# Patient Record
Sex: Female | Born: 1935 | Race: White | Hispanic: No | Smoking: Former smoker
Health system: Southern US, Community
[De-identification: ages and names within clinical notes are randomized; demographics above are authoritative.]

## PROBLEM LIST (undated history)

## (undated) DIAGNOSIS — I10 Essential (primary) hypertension: Secondary | ICD-10-CM

## (undated) DIAGNOSIS — F0390 Unspecified dementia without behavioral disturbance: Secondary | ICD-10-CM

## (undated) DIAGNOSIS — I499 Cardiac arrhythmia, unspecified: Secondary | ICD-10-CM

## (undated) DIAGNOSIS — M810 Age-related osteoporosis without current pathological fracture: Secondary | ICD-10-CM

## (undated) DIAGNOSIS — R001 Bradycardia, unspecified: Secondary | ICD-10-CM

## (undated) DIAGNOSIS — F419 Anxiety disorder, unspecified: Secondary | ICD-10-CM

## (undated) DIAGNOSIS — G4700 Insomnia, unspecified: Secondary | ICD-10-CM

---

## 2010-02-12 ENCOUNTER — Emergency Department: Payer: Self-pay | Admitting: Internal Medicine

## 2010-04-18 ENCOUNTER — Ambulatory Visit: Payer: Self-pay | Admitting: Unknown Physician Specialty

## 2010-07-06 ENCOUNTER — Emergency Department: Payer: Self-pay | Admitting: Emergency Medicine

## 2010-10-19 ENCOUNTER — Encounter: Payer: Self-pay | Admitting: Internal Medicine

## 2011-07-25 ENCOUNTER — Encounter: Payer: Self-pay | Admitting: Internal Medicine

## 2011-07-30 ENCOUNTER — Telehealth: Payer: Self-pay | Admitting: *Deleted

## 2011-07-30 NOTE — Telephone Encounter (Signed)
Spoke with Boyd Kerbs from [employer] [employer] at [place] regarding patients UA results. She says that patient has been really confused and just not actin herself. She says that they will need a written order from you to start the bactrum or whatever treatment needs to be done. She faxed me over patients Vial of Life since we do not have her chart and put in patient's meds and allergies. Order will need to be faxed over to [phone].

## 2011-07-31 MED ORDER — SULFAMETHOXAZOLE-TRIMETHOPRIM 800-160 MG PO TABS: 1.0000 | ORAL_TABLET | Freq: Two times a day (BID) | ORAL | Status: AC

## 2011-07-31 NOTE — Telephone Encounter (Signed)
Bactrim DS tab po bid x 7 days, disp 14. Please confirm NO sulfa allergy.

## 2011-07-31 NOTE — Telephone Encounter (Signed)
Patient does not have a sulfa allergy. Printed rx for Dr. Dan Humphreys to sign. Faxed signed rx to [facility].

## 2011-08-01 ENCOUNTER — Other Ambulatory Visit: Payer: Self-pay | Admitting: Internal Medicine

## 2011-08-01 MED ORDER — AMLODIPINE BESY-BENAZEPRIL HCL 10-20 MG PO CAPS: 1.0000 | ORAL_CAPSULE | Freq: Every day | ORAL | Status: DC

## 2011-08-01 MED ORDER — DONEPEZIL HCL 10 MG PO TABS: 10.0000 mg | ORAL_TABLET | Freq: Every day | ORAL | Status: AC

## 2011-08-14 ENCOUNTER — Encounter: Payer: Self-pay | Admitting: Internal Medicine

## 2011-08-16 ENCOUNTER — Telehealth: Payer: Self-pay | Admitting: *Deleted

## 2011-08-16 ENCOUNTER — Encounter: Payer: Self-pay | Admitting: *Deleted

## 2011-08-16 NOTE — Telephone Encounter (Signed)
Message copied by Jobie Quaker on Fri Aug 16, 2011  8:54 AM ------      Message from: Ronna Polio A      Created: Wed Aug 14, 2011  8:07 AM      Regarding: labs       Labs were normal. We can review at next visit.

## 2011-08-16 NOTE — Telephone Encounter (Signed)
Boyd Kerbs @ [facility] called to see when ms Delarocha was diagnosed with demitia   She needs dates Please call

## 2011-08-16 NOTE — Telephone Encounter (Signed)
Can you please take care of this?

## 2011-08-19 ENCOUNTER — Encounter: Payer: Self-pay | Admitting: Internal Medicine

## 2011-08-27 ENCOUNTER — Encounter: Payer: Self-pay | Admitting: Internal Medicine

## 2011-09-19 ENCOUNTER — Encounter: Payer: Self-pay | Admitting: Internal Medicine

## 2011-09-26 ENCOUNTER — Ambulatory Visit: Payer: Self-pay

## 2011-10-15 ENCOUNTER — Telehealth: Payer: Self-pay | Admitting: Internal Medicine

## 2011-10-15 NOTE — Telephone Encounter (Signed)
Received results on abnormal urinalysis from [facility]. It was ordered by Dr. Dareen Piano. Has pt been treated?

## 2011-10-16 MED ORDER — CIPROFLOXACIN HCL 250 MG PO TABS: 250.0000 mg | ORAL_TABLET | Freq: Two times a day (BID) | ORAL | Status: AC

## 2011-10-16 NOTE — Telephone Encounter (Signed)
Spoke w/patient, she was unsure, asked to speak with nurse but no one was avail at the time. Will call back later to discuss w/nurse.

## 2011-10-16 NOTE — Telephone Encounter (Signed)
Spoke w/nurse. Patient has not been treated, they put Dr Ewell Poe name in error, should have been under Dr Dan Humphreys. Would you like to prescribe antibiotics? Culture is still pending. Fax # [phone]

## 2011-10-16 NOTE — Telephone Encounter (Signed)
Faxed order and called facility to inform

## 2011-10-16 NOTE — Telephone Encounter (Signed)
Cipro 250 mg po bid x 7 days.

## 2011-10-19 ENCOUNTER — Encounter: Payer: Self-pay | Admitting: Internal Medicine

## 2011-10-30 ENCOUNTER — Ambulatory Visit (INDEPENDENT_AMBULATORY_CARE_PROVIDER_SITE_OTHER): Payer: Medicare Other | Admitting: Internal Medicine

## 2011-10-30 ENCOUNTER — Encounter: Payer: Self-pay | Admitting: Internal Medicine

## 2011-10-30 DIAGNOSIS — G4700 Insomnia, unspecified: Secondary | ICD-10-CM

## 2011-10-30 DIAGNOSIS — F0390 Unspecified dementia without behavioral disturbance: Secondary | ICD-10-CM

## 2011-10-30 DIAGNOSIS — J40 Bronchitis, not specified as acute or chronic: Secondary | ICD-10-CM

## 2011-10-30 DIAGNOSIS — F0280 Dementia in other diseases classified elsewhere without behavioral disturbance: Secondary | ICD-10-CM | POA: Insufficient documentation

## 2011-10-30 DIAGNOSIS — R300 Dysuria: Secondary | ICD-10-CM

## 2011-10-30 MED ORDER — AZITHROMYCIN 250 MG PO TABS: ORAL_TABLET | ORAL | Status: DC

## 2011-10-30 MED ORDER — AZITHROMYCIN 250 MG PO TABS: ORAL_TABLET | ORAL | Status: AC

## 2011-10-30 NOTE — Progress Notes (Signed)
Subjective:    Patient ID: Jamie Perez, female    DOB: [birth year], 77 y.o.   MRN: [id number]  HPI 77 year old female with a history of dementia presents for an acute visit. Nursing staff at her facility noted her to have increased nasal congestion and some wheezing on exam. Nursing staff denies any fever. Patient is unable to provide any history.  Nursing staff also notes some increased urinary frequency. He denies any fever or chills. Staff was also noted that patient has difficulty sleeping. They have recently changed her from trazodone and Ambien.  Outpatient Encounter Prescriptions as of 10/30/2011  Medication Sig Dispense Refill  . amLODipine-benazepril (LOTREL) 10-20 MG per capsule Take 1 capsule by mouth daily.  30 capsule  11  . Cholecalciferol (VITAMIN D) 2000 UNITS CAPS Take by mouth daily.        . clonazePAM (KLONOPIN) 0.5 MG tablet Take 0.25 mg by mouth 2 (two) times daily as needed.        Marland Kitchen DOCUSATE SODIUM PO Take by mouth daily.        Marland Kitchen donepezil (ARICEPT) 10 MG tablet Take 1 tablet (10 mg total) by mouth daily.  30 tablet  6  . Ferrous Gluconate (IRON) 240 (27 FE) MG TABS Take by mouth daily.        . fluticasone (FLONASE) 50 MCG/ACT nasal spray Place 2 sprays into the nose.        . Multiple Vitamin (MULTIVITAMIN) tablet Take 1 tablet by mouth daily.        . psyllium (METAMUCIL) 58.6 % powder Take 1 packet by mouth. Mixed with 6-8 fluid ounces daily       . sertraline (ZOLOFT) 50 MG tablet Take 50 mg by mouth daily.        . traMADol (ULTRAM) 50 MG tablet Take 50 mg by mouth 2 (two) times daily as needed.        . traZODone (DESYREL) 100 MG tablet Take 100 mg by mouth at bedtime.        . vitamin C (ASCORBIC ACID) 500 MG tablet Take 500 mg by mouth daily.        . vitamin E 400 UNIT capsule Take 400 Units by mouth daily.        Marland Kitchen zolpidem (AMBIEN) 5 MG tablet Take 5 mg by mouth at bedtime as needed.          Review of Systems  Unable to perform ROS Constitutional:  Negative for fever (per nurse).   BP 140/60  Pulse 60  Temp(Src) 98.1 F (36.7 C) (Oral)  Wt 130 lb (58.968 kg)  SpO2 96%     Objective:   Physical Exam  Constitutional: She is oriented to person, place, and time. She appears well-developed and well-nourished. No distress.  HENT:  Head: Normocephalic and atraumatic.  Right Ear: External ear normal.  Left Ear: External ear normal.  Nose: Nose normal.  Mouth/Throat: Oropharynx is clear and moist. No oropharyngeal exudate.  Eyes: Conjunctivae are normal. Pupils are equal, round, and reactive to light. Right eye exhibits no discharge. Left eye exhibits no discharge. No scleral icterus.  Neck: Normal range of motion. Neck supple. No tracheal deviation present. No thyromegaly present.  Cardiovascular: Normal rate, regular rhythm, normal heart sounds and intact distal pulses.  Exam reveals no gallop and no friction rub.   No murmur heard. Pulmonary/Chest: Effort normal. No respiratory distress. She has no decreased breath sounds. She has no wheezes. She has rhonchi in  the right lower field and the left lower field. She has no rales. She exhibits no tenderness.  Musculoskeletal: Normal range of motion. She exhibits no edema and no tenderness.  Lymphadenopathy:    She has no cervical adenopathy.  Neurological: She is alert and oriented to person, place, and time. No cranial nerve deficit. She exhibits normal muscle tone. Coordination normal.  Skin: Skin is warm and dry. No rash noted. She is not diaphoretic. No erythema. No pallor.  Psychiatric: She has a normal mood and affect. Her speech is normal and behavior is normal. Thought content normal. Cognition and memory are impaired. She exhibits abnormal recent memory and abnormal remote memory.          Assessment & Plan:  1. Bronchitis - exam is most consistent with mild bronchitis. Will treat with azithromycin. Nursing staff will provide up to date and have patient return to clinic if  symptoms are not improving.  2. Dysuria - nursing staff noted increased urinary frequency. Will check UA and culture today.  3. Insomnia - nursing staff noted some insomnia recently. She was changed from trazodone to Ambien. If no improvement with this would recommend Seroquel 25 mg at bedtime. Nursing staff will call with update.

## 2011-10-31 LAB — CBC WITH DIFFERENTIAL/PLATELET
Basophils Relative: 0.8 % (ref 0.0–3.0)
Eosinophils Absolute: 0.6 10*3/uL (ref 0.0–0.7)
Lymphocytes Relative: 35 % (ref 12.0–46.0)
MCHC: 34 g/dL (ref 30.0–36.0)
Neutrophils Relative %: 47.4 % (ref 43.0–77.0)
RBC: 4.57 Mil/uL (ref 3.87–5.11)
WBC: 6.4 10*3/uL (ref 4.5–10.5)

## 2011-10-31 LAB — COMPREHENSIVE METABOLIC PANEL
ALT: 17 U/L (ref 0–35)
AST: 21 U/L (ref 0–37)
Albumin: 4.1 g/dL (ref 3.5–5.2)
BUN: 33 mg/dL — ABNORMAL HIGH (ref 6–23)
Calcium: 9.4 mg/dL (ref 8.4–10.5)
Chloride: 104 mEq/L (ref 96–112)
Potassium: 6 mEq/L — ABNORMAL HIGH (ref 3.5–5.1)
Total Protein: 6.7 g/dL (ref 6.0–8.3)

## 2011-11-04 ENCOUNTER — Telehealth: Payer: Self-pay | Admitting: Internal Medicine

## 2011-11-04 NOTE — Telephone Encounter (Signed)
Refaxed order  

## 2011-11-04 NOTE — Telephone Encounter (Signed)
FAX [phone]

## 2011-11-19 ENCOUNTER — Encounter: Payer: Self-pay | Admitting: Internal Medicine

## 2011-12-06 ENCOUNTER — Inpatient Hospital Stay: Payer: Self-pay | Admitting: Internal Medicine

## 2011-12-06 DIAGNOSIS — R55 Syncope and collapse: Secondary | ICD-10-CM

## 2011-12-06 DIAGNOSIS — I499 Cardiac arrhythmia, unspecified: Secondary | ICD-10-CM

## 2011-12-06 DIAGNOSIS — I359 Nonrheumatic aortic valve disorder, unspecified: Secondary | ICD-10-CM

## 2011-12-06 LAB — URINALYSIS, COMPLETE
Bilirubin,UR: NEGATIVE
Glucose,UR: NEGATIVE mg/dL (ref 0–75)
Leukocyte Esterase: NEGATIVE
Nitrite: NEGATIVE
Protein: NEGATIVE
RBC,UR: 1 /HPF (ref 0–5)
WBC UR: 1 /HPF (ref 0–5)

## 2011-12-06 LAB — TROPONIN I: Troponin-I: <0.02 ng/mL

## 2011-12-06 LAB — CBC
HGB: 13.6 g/dL (ref 12.0–16.0)
MCH: 29.4 pg (ref 26.0–34.0)
MCV: 89 fL (ref 80–100)
Platelet: 190 10*3/uL (ref 150–440)
RBC: 4.63 10*6/uL (ref 3.80–5.20)
RDW: 14.1 % (ref 11.5–14.5)

## 2011-12-06 LAB — COMPREHENSIVE METABOLIC PANEL
Alkaline Phosphatase: 72 U/L (ref 50–136)
Anion Gap: 15 (ref 7–16)
BUN: 25 mg/dL — ABNORMAL HIGH (ref 7–18)
Bilirubin,Total: 0.5 mg/dL (ref 0.2–1.0)
Chloride: 106 mmol/L (ref 98–107)
Creatinine: 1.05 mg/dL (ref 0.60–1.30)
EGFR (African American): >60
Glucose: 122 mg/dL — ABNORMAL HIGH (ref 65–99)
SGOT(AST): 25 U/L (ref 15–37)
SGPT (ALT): 17 U/L
Total Protein: 6.7 g/dL (ref 6.4–8.2)

## 2011-12-06 LAB — CK TOTAL AND CKMB (NOT AT ARMC)
CK, Total: 95 U/L (ref 21–215)
CK, Total: 85 U/L (ref 21–215)
CK-MB: 1.4 ng/mL (ref 0.5–3.6)
CK-MB: 2 ng/mL (ref 0.5–3.6)

## 2011-12-07 DIAGNOSIS — I498 Other specified cardiac arrhythmias: Secondary | ICD-10-CM

## 2011-12-07 LAB — BASIC METABOLIC PANEL
Anion Gap: 11 (ref 7–16)
BUN: 23 mg/dL — ABNORMAL HIGH (ref 7–18)
Calcium, Total: 7.9 mg/dL — ABNORMAL LOW (ref 8.5–10.1)
Chloride: 109 mmol/L — ABNORMAL HIGH (ref 98–107)
EGFR (African American): >60
EGFR (Non-African Amer.): >60
Glucose: 124 mg/dL — ABNORMAL HIGH (ref 65–99)
Potassium: 3.8 mmol/L (ref 3.5–5.1)
Sodium: 146 mmol/L — ABNORMAL HIGH (ref 136–145)

## 2011-12-07 LAB — CBC WITH DIFFERENTIAL/PLATELET
Basophil %: 0.3 %
Eosinophil #: 0 10*3/uL (ref 0.0–0.7)
Eosinophil %: 0.1 %
HCT: 35.5 % (ref 35.0–47.0)
HGB: 11.8 g/dL — ABNORMAL LOW (ref 12.0–16.0)
Lymphocyte %: 13.7 %
MCH: 29.3 pg (ref 26.0–34.0)
MCHC: 33.4 g/dL (ref 32.0–36.0)
Monocyte %: 8.4 %
Neutrophil #: 6.9 10*3/uL — ABNORMAL HIGH (ref 1.4–6.5)
Platelet: 128 10*3/uL — ABNORMAL LOW (ref 150–440)
RBC: 4.04 10*6/uL (ref 3.80–5.20)
WBC: 8.9 10*3/uL (ref 3.6–11.0)

## 2011-12-07 LAB — TSH: Thyroid Stimulating Horm: 0.751 u[IU]/mL

## 2011-12-07 LAB — CK TOTAL AND CKMB (NOT AT ARMC): CK-MB: 2.3 ng/mL (ref 0.5–3.6)

## 2011-12-08 LAB — BASIC METABOLIC PANEL
Anion Gap: 11 (ref 7–16)
Calcium, Total: 7.9 mg/dL — ABNORMAL LOW (ref 8.5–10.1)
Chloride: 106 mmol/L (ref 98–107)
Co2: 23 mmol/L (ref 21–32)
Creatinine: 0.62 mg/dL (ref 0.60–1.30)
EGFR (African American): >60

## 2011-12-08 LAB — CBC WITH DIFFERENTIAL/PLATELET
Basophil %: 0.4 %
HCT: 37.3 % (ref 35.0–47.0)
HGB: 12.2 g/dL (ref 12.0–16.0)
Lymphocyte %: 13.8 %
MCV: 89 fL (ref 80–100)
Monocyte #: 0.6 10*3/uL (ref 0.0–0.7)
Monocyte %: 7.4 %
Neutrophil #: 6.2 10*3/uL (ref 1.4–6.5)

## 2011-12-09 LAB — CBC WITH DIFFERENTIAL/PLATELET
Basophil #: 0 10*3/uL (ref 0.0–0.1)
Basophil %: 0.6 %
Eosinophil %: 6.8 %
HCT: 39.3 % (ref 35.0–47.0)
HGB: 13.1 g/dL (ref 12.0–16.0)
Lymphocyte #: 1.1 10*3/uL (ref 1.0–3.6)
Lymphocyte %: 18.3 %
MCH: 29.4 pg (ref 26.0–34.0)
MCV: 88 fL (ref 80–100)
Monocyte #: 0.4 10*3/uL (ref 0.0–0.7)
Monocyte %: 7.5 %
Neutrophil %: 66.8 %
Platelet: 122 10*3/uL — ABNORMAL LOW (ref 150–440)
RBC: 4.46 10*6/uL (ref 3.80–5.20)
WBC: 5.8 10*3/uL (ref 3.6–11.0)

## 2011-12-09 LAB — BASIC METABOLIC PANEL
Calcium, Total: 8.4 mg/dL — ABNORMAL LOW (ref 8.5–10.1)
Co2: 29 mmol/L (ref 21–32)
EGFR (African American): >60
Glucose: 96 mg/dL (ref 65–99)
Osmolality: 282 (ref 275–301)
Potassium: 4 mmol/L (ref 3.5–5.1)

## 2011-12-09 LAB — HEMOGLOBIN A1C: Hemoglobin A1C: 6 % (ref 4.2–6.3)

## 2011-12-11 ENCOUNTER — Encounter: Payer: Self-pay | Admitting: Internal Medicine

## 2011-12-20 ENCOUNTER — Encounter: Payer: Self-pay | Admitting: Internal Medicine

## 2011-12-23 ENCOUNTER — Encounter: Payer: Self-pay | Admitting: Cardiovascular Disease

## 2011-12-23 DIAGNOSIS — I498 Other specified cardiac arrhythmias: Secondary | ICD-10-CM

## 2011-12-24 ENCOUNTER — Encounter: Payer: Self-pay | Admitting: Internal Medicine

## 2011-12-24 ENCOUNTER — Ambulatory Visit (INDEPENDENT_AMBULATORY_CARE_PROVIDER_SITE_OTHER): Payer: Medicare Other | Admitting: Internal Medicine

## 2011-12-24 VITALS — HR 88 | Temp 97.2°F | Wt 124.0 lb

## 2011-12-24 DIAGNOSIS — R001 Bradycardia, unspecified: Secondary | ICD-10-CM

## 2011-12-24 DIAGNOSIS — F329 Major depressive disorder, single episode, unspecified: Secondary | ICD-10-CM

## 2011-12-24 DIAGNOSIS — F32A Depression, unspecified: Secondary | ICD-10-CM

## 2011-12-24 DIAGNOSIS — T148XXA Other injury of unspecified body region, initial encounter: Secondary | ICD-10-CM

## 2011-12-24 DIAGNOSIS — IMO0002 Reserved for concepts with insufficient information to code with codable children: Secondary | ICD-10-CM | POA: Insufficient documentation

## 2011-12-24 DIAGNOSIS — F0390 Unspecified dementia without behavioral disturbance: Secondary | ICD-10-CM

## 2011-12-24 DIAGNOSIS — I498 Other specified cardiac arrhythmias: Secondary | ICD-10-CM

## 2011-12-24 DIAGNOSIS — I10 Essential (primary) hypertension: Secondary | ICD-10-CM

## 2011-12-24 MED ORDER — LAMOTRIGINE 25 MG PO TABS: 25.0000 mg | ORAL_TABLET | Freq: Every day | ORAL | Status: DC

## 2011-12-24 MED ORDER — SERTRALINE HCL 50 MG PO TABS: 75.0000 mg | ORAL_TABLET | Freq: Every day | ORAL | Status: AC

## 2011-12-24 MED ORDER — LISINOPRIL 10 MG PO TABS: 10.0000 mg | ORAL_TABLET | Freq: Every day | ORAL | Status: AC

## 2011-12-24 MED ORDER — MIRTAZAPINE 7.5 MG PO TABS: 7.5000 mg | ORAL_TABLET | Freq: Every day | ORAL | Status: AC

## 2011-12-24 NOTE — Assessment & Plan Note (Signed)
Severe. Despite several recent changes in medications per psychiatry, patient appears more agitated and confused today. Will review records from psychiatry. We'll plan to have her followup in one month.

## 2011-12-24 NOTE — Assessment & Plan Note (Signed)
Based on discharge summary, plan was for patient to wear Holter monitor and then followup with cardiology. We'll set up followup with Dr. Lorenz Coaster.

## 2011-12-24 NOTE — Progress Notes (Signed)
  Subjective:    Patient ID: Jamie Perez, female    DOB: July 23, 1925, 78 y.o.   MRN: [id number]  HPI 65 old female with history of dementia presents for followup after recent hospitalization for bradycardia and dizziness. Based on discharge summary, plan was for her to have Holter monitor and then followup with cardiology. It is not clear that she has ever seen her cardiologist. She is unable to answer questions about recent episodes of dizziness. Nursing staff from her facility reports numerous recent falls.  Nursing staff reports a fall today which resulted in a skin tear on her left distal arm. They have been wrapping this area and gauze. She has not had any other noted injuries during these falls. She has not lost consciousness during these falls. She continues to use a walker to help stabilize her gait.   Review of Systems  Unable to perform ROS      Objective:   Physical Exam  Constitutional: She is oriented to person, place, and time. She appears well-developed and well-nourished. No distress.  HENT:  Head: Normocephalic and atraumatic.  Right Ear: External ear normal.  Left Ear: External ear normal.  Nose: Nose normal.  Mouth/Throat: Oropharynx is clear and moist. No oropharyngeal exudate.  Eyes: Conjunctivae are normal. Pupils are equal, round, and reactive to light. Right eye exhibits no discharge. Left eye exhibits no discharge. No scleral icterus.  Neck: Normal range of motion. Neck supple. No tracheal deviation present. No thyromegaly present.  Cardiovascular: Normal rate, regular rhythm, normal heart sounds and intact distal pulses.  Exam reveals no gallop and no friction rub.   No murmur heard. Pulmonary/Chest: Effort normal and breath sounds normal. No respiratory distress. She has no wheezes. She has no rales. She exhibits no tenderness.  Musculoskeletal: Normal range of motion. She exhibits no edema and no tenderness.  Lymphadenopathy:    She has no cervical  adenopathy.  Neurological: She is alert and oriented to person, place, and time. No cranial nerve deficit. She exhibits normal muscle tone. Coordination normal.  Skin: Skin is warm and dry. No rash noted. She is not diaphoretic. No erythema. No pallor.     Psychiatric: Her affect is angry and labile. She is agitated and aggressive. Thought content is delusional. Cognition and memory are impaired. She expresses impulsivity and inappropriate judgment. She exhibits abnormal recent memory and abnormal remote memory.          Assessment & Plan:

## 2011-12-24 NOTE — Assessment & Plan Note (Signed)
Skin tear over the left distal arm. Wound was dressed with Tegaderm today. Instructions written for nursing staff to replace Tegaderm daily as needed, leaving dressing for up to 3 days if intact. If wound is not healing over the next week, will set up patient with wound healing Center.

## 2012-01-05 LAB — URINALYSIS, COMPLETE
Bilirubin,UR: NEGATIVE
Glucose,UR: NEGATIVE mg/dL (ref 0–75)
Nitrite: POSITIVE
Ph: 6 (ref 4.5–8.0)
WBC UR: 31 /HPF (ref 0–5)

## 2012-01-07 LAB — URINE CULTURE

## 2012-01-08 ENCOUNTER — Telehealth: Payer: Self-pay | Admitting: *Deleted

## 2012-01-08 NOTE — Telephone Encounter (Signed)
U/a received from [facility]. Rx for cipro 250 bid x 5 days written by Dr Dan Humphreys and faxed in yesterday.

## 2012-01-17 ENCOUNTER — Encounter: Payer: Self-pay | Admitting: Internal Medicine

## 2012-01-21 ENCOUNTER — Ambulatory Visit (INDEPENDENT_AMBULATORY_CARE_PROVIDER_SITE_OTHER): Payer: Medicare Other | Admitting: Internal Medicine

## 2012-01-21 ENCOUNTER — Encounter: Payer: Self-pay | Admitting: Internal Medicine

## 2012-01-21 DIAGNOSIS — F0390 Unspecified dementia without behavioral disturbance: Secondary | ICD-10-CM

## 2012-01-21 NOTE — Assessment & Plan Note (Signed)
Progressive. Symptoms are better controlled on current medications. We'll continue to monitor. Followup in 6 months.

## 2012-01-21 NOTE — Progress Notes (Signed)
Subjective:    Patient ID: Jamie Perez, female    DOB: 1925-10-29, 78 y.o.   MRN: [id number]  HPI 78 year old female with history of dementia presents for followup. She reports she is doing well. Her caregiver today does not know of any ongoing concerns. Patient reports good appetite and normal activity level. She denies any shortness of breath, fever, chills, chest pain. She notes that the recent skin tear on her right arm has healed.  Outpatient Encounter Prescriptions as of 01/21/2012  Medication Sig Dispense Refill  . Cholecalciferol (VITAMIN D) 2000 UNITS CAPS Take by mouth daily.        . clonazePAM (KLONOPIN) 0.5 MG tablet Take 0.25 mg by mouth 2 (two) times daily as needed.        Marland Kitchen DOCUSATE SODIUM PO Take by mouth daily.        Marland Kitchen donepezil (ARICEPT) 10 MG tablet Take 1 tablet (10 mg total) by mouth daily.  30 tablet  6  . eszopiclone (LUNESTA) 1 MG TABS Take 1 mg by mouth at bedtime as needed. Take immediately before bedtime      . Ferrous Gluconate (IRON) 240 (27 FE) MG TABS Take by mouth daily.        . fluticasone (FLONASE) 50 MCG/ACT nasal spray Place 2 sprays into the nose.        . lamoTRIgine (LAMICTAL) 25 MG tablet Take 50 mg by mouth daily.      Marland Kitchen lisinopril (PRINIVIL,ZESTRIL) 10 MG tablet Take 1 tablet (10 mg total) by mouth daily.  90 tablet  3  . mirtazapine (REMERON) 7.5 MG tablet Take 1 tablet (7.5 mg total) by mouth at bedtime.  30 tablet  3  . Multiple Vitamin (MULTIVITAMIN) tablet Take 1 tablet by mouth daily.        . psyllium (METAMUCIL) 58.6 % powder Take 1 packet by mouth. Mixed with 6-8 fluid ounces daily       . sertraline (ZOLOFT) 50 MG tablet Take 1.5 tablets (75 mg total) by mouth daily.  45 tablet  3  . traMADol (ULTRAM) 50 MG tablet Take 50 mg by mouth 2 (two) times daily as needed.        . vitamin C (ASCORBIC ACID) 500 MG tablet Take 500 mg by mouth daily.        . vitamin E 400 UNIT capsule Take 400 Units by mouth daily.        Marland Kitchen DISCONTD: lamoTRIgine  (LAMICTAL) 25 MG tablet Take 1 tablet (25 mg total) by mouth daily.  30 tablet  3    Review of Systems  Constitutional: Negative for fever, chills, appetite change, fatigue and unexpected weight change.  HENT: Negative for trouble swallowing and sinus pressure.   Respiratory: Negative for cough and shortness of breath.   Cardiovascular: Negative for chest pain.  Gastrointestinal: Negative for abdominal pain.  Musculoskeletal: Negative for gait problem.  Skin: Positive for color change. Negative for rash.  Psychiatric/Behavioral: Positive for hallucinations, confusion, sleep disturbance and decreased concentration. Negative for suicidal ideas. The patient is nervous/anxious.        Objective:   Physical Exam  Constitutional: She is oriented to person, place, and time. She appears well-developed and well-nourished. No distress.  HENT:  Head: Normocephalic and atraumatic.  Right Ear: External ear normal.  Left Ear: External ear normal.  Nose: Nose normal.  Mouth/Throat: Oropharynx is clear and moist. No oropharyngeal exudate.  Eyes: Conjunctivae are normal. Pupils are equal, round, and reactive  to light. Right eye exhibits no discharge. Left eye exhibits no discharge. No scleral icterus.  Neck: Normal range of motion. Neck supple. No tracheal deviation present. No thyromegaly present.  Cardiovascular: Normal rate, regular rhythm, normal heart sounds and intact distal pulses.  Exam reveals no gallop and no friction rub.   No murmur heard. Pulmonary/Chest: Effort normal and breath sounds normal. No respiratory distress. She has no wheezes. She has no rales. She exhibits no tenderness.  Musculoskeletal: Normal range of motion. She exhibits no edema and no tenderness.  Lymphadenopathy:    She has no cervical adenopathy.  Neurological: She is alert and oriented to person, place, and time. No cranial nerve deficit. She exhibits normal muscle tone. Coordination normal.  Skin: Skin is warm and  dry. No rash noted. She is not diaphoretic. No erythema. No pallor.  Psychiatric: She has a normal mood and affect. Her speech is normal and behavior is normal. Judgment and thought content normal. Cognition and memory are impaired. She exhibits abnormal recent memory and abnormal remote memory.          Assessment & Plan:

## 2012-01-28 ENCOUNTER — Encounter: Payer: Self-pay | Admitting: Internal Medicine

## 2012-02-17 ENCOUNTER — Encounter: Payer: Self-pay | Admitting: Internal Medicine

## 2012-03-18 ENCOUNTER — Encounter: Payer: Self-pay | Admitting: Internal Medicine

## 2012-03-26 ENCOUNTER — Emergency Department: Payer: Self-pay | Admitting: Emergency Medicine

## 2012-04-02 ENCOUNTER — Emergency Department: Payer: Self-pay | Admitting: Emergency Medicine

## 2012-04-02 LAB — CBC
HGB: 14.6 g/dL (ref 12.0–16.0)
MCH: 28.1 pg (ref 26.0–34.0)
MCV: 87 fL (ref 80–100)
RBC: 5.18 10*6/uL (ref 3.80–5.20)
RDW: 15 % — ABNORMAL HIGH (ref 11.5–14.5)

## 2012-04-02 LAB — COMPREHENSIVE METABOLIC PANEL
Albumin: 3.5 g/dL (ref 3.4–5.0)
Alkaline Phosphatase: 89 U/L (ref 50–136)
Anion Gap: 6 — ABNORMAL LOW (ref 7–16)
BUN: 31 mg/dL — ABNORMAL HIGH (ref 7–18)
Bilirubin,Total: 0.5 mg/dL (ref 0.2–1.0)
Calcium, Total: 8.8 mg/dL (ref 8.5–10.1)
Chloride: 107 mmol/L (ref 98–107)
Creatinine: 1.42 mg/dL — ABNORMAL HIGH (ref 0.60–1.30)
EGFR (Non-African Amer.): 34 — ABNORMAL LOW
Glucose: 140 mg/dL — ABNORMAL HIGH (ref 65–99)
Total Protein: 7 g/dL (ref 6.4–8.2)

## 2012-04-02 LAB — CK TOTAL AND CKMB (NOT AT ARMC)
CK, Total: 88 U/L (ref 21–215)
CK-MB: 1.7 ng/mL (ref 0.5–3.6)

## 2012-04-21 ENCOUNTER — Encounter: Payer: Self-pay | Admitting: Internal Medicine

## 2012-04-21 LAB — URINALYSIS, COMPLETE
Blood: NEGATIVE
Glucose,UR: NEGATIVE mg/dL (ref 0–75)
Ph: 5 (ref 4.5–8.0)
Protein: NEGATIVE
RBC,UR: 4 /HPF (ref 0–5)
Specific Gravity: 1.02 (ref 1.003–1.030)
Squamous Epithelial: 3
WBC UR: 55 /HPF (ref 0–5)

## 2012-04-24 ENCOUNTER — Telehealth: Payer: Self-pay | Admitting: Internal Medicine

## 2012-04-24 NOTE — Telephone Encounter (Signed)
Yes

## 2012-04-24 NOTE — Telephone Encounter (Signed)
Urinalysis sent over from Dr. Dareen Piano was abnormal. Has this been treated?

## 2012-05-01 ENCOUNTER — Emergency Department: Payer: Self-pay | Admitting: Emergency Medicine

## 2012-05-01 LAB — URINALYSIS, COMPLETE
Bilirubin,UR: NEGATIVE
Blood: NEGATIVE
Glucose,UR: NEGATIVE mg/dL (ref 0–75)
Leukocyte Esterase: NEGATIVE
Nitrite: NEGATIVE
Ph: 6 (ref 4.5–8.0)
Protein: NEGATIVE
Specific Gravity: 1.003 (ref 1.003–1.030)
Squamous Epithelial: NONE SEEN
WBC UR: <1 /HPF (ref 0–5)

## 2012-05-01 LAB — COMPREHENSIVE METABOLIC PANEL
Albumin: 3.7 g/dL (ref 3.4–5.0)
Co2: 28 mmol/L (ref 21–32)
Creatinine: 0.88 mg/dL (ref 0.60–1.30)
EGFR (Non-African Amer.): 60 — ABNORMAL LOW
Glucose: 86 mg/dL (ref 65–99)
Osmolality: 281 (ref 275–301)
Potassium: 4 mmol/L (ref 3.5–5.1)
SGPT (ALT): 19 U/L
Total Protein: 6.9 g/dL (ref 6.4–8.2)

## 2012-05-01 LAB — CBC
HCT: 43.2 % (ref 35.0–47.0)
HGB: 13.9 g/dL (ref 12.0–16.0)
MCH: 28 pg (ref 26.0–34.0)
MCHC: 32.1 g/dL (ref 32.0–36.0)
MCV: 87 fL (ref 80–100)
RBC: 4.96 10*6/uL (ref 3.80–5.20)
WBC: 4.1 10*3/uL (ref 3.6–11.0)

## 2012-05-01 LAB — TROPONIN I: Troponin-I: <0.02 ng/mL

## 2012-05-18 ENCOUNTER — Encounter: Payer: Self-pay | Admitting: Internal Medicine

## 2012-06-08 LAB — URINALYSIS, COMPLETE
Bilirubin,UR: NEGATIVE
Blood: NEGATIVE
Ketone: NEGATIVE
Ph: 5 (ref 4.5–8.0)
Protein: NEGATIVE
Squamous Epithelial: 1
WBC UR: 19 /HPF (ref 0–5)

## 2012-06-18 ENCOUNTER — Encounter: Payer: Self-pay | Admitting: Internal Medicine

## 2012-07-18 LAB — URINALYSIS, COMPLETE
Bilirubin,UR: NEGATIVE
Glucose,UR: NEGATIVE mg/dL (ref 0–75)
Nitrite: NEGATIVE
Ph: 5 (ref 4.5–8.0)
Protein: NEGATIVE
RBC,UR: 1 /HPF (ref 0–5)

## 2012-07-19 ENCOUNTER — Encounter: Payer: Self-pay | Admitting: Internal Medicine

## 2012-07-20 LAB — URINE CULTURE

## 2012-08-18 ENCOUNTER — Encounter: Payer: Self-pay | Admitting: Internal Medicine

## 2012-08-25 IMAGING — CR RIGHT HIP - COMPLETE 2+ VIEW
1 series · 2 of 2 positions shown · non-contrast
Comparison: none

REASON FOR EXAM: pain
COMMENTS:

[Series 1: t hip ap right · 0.14mm/px · 2 of 2 slices shown]
[im 1/2]
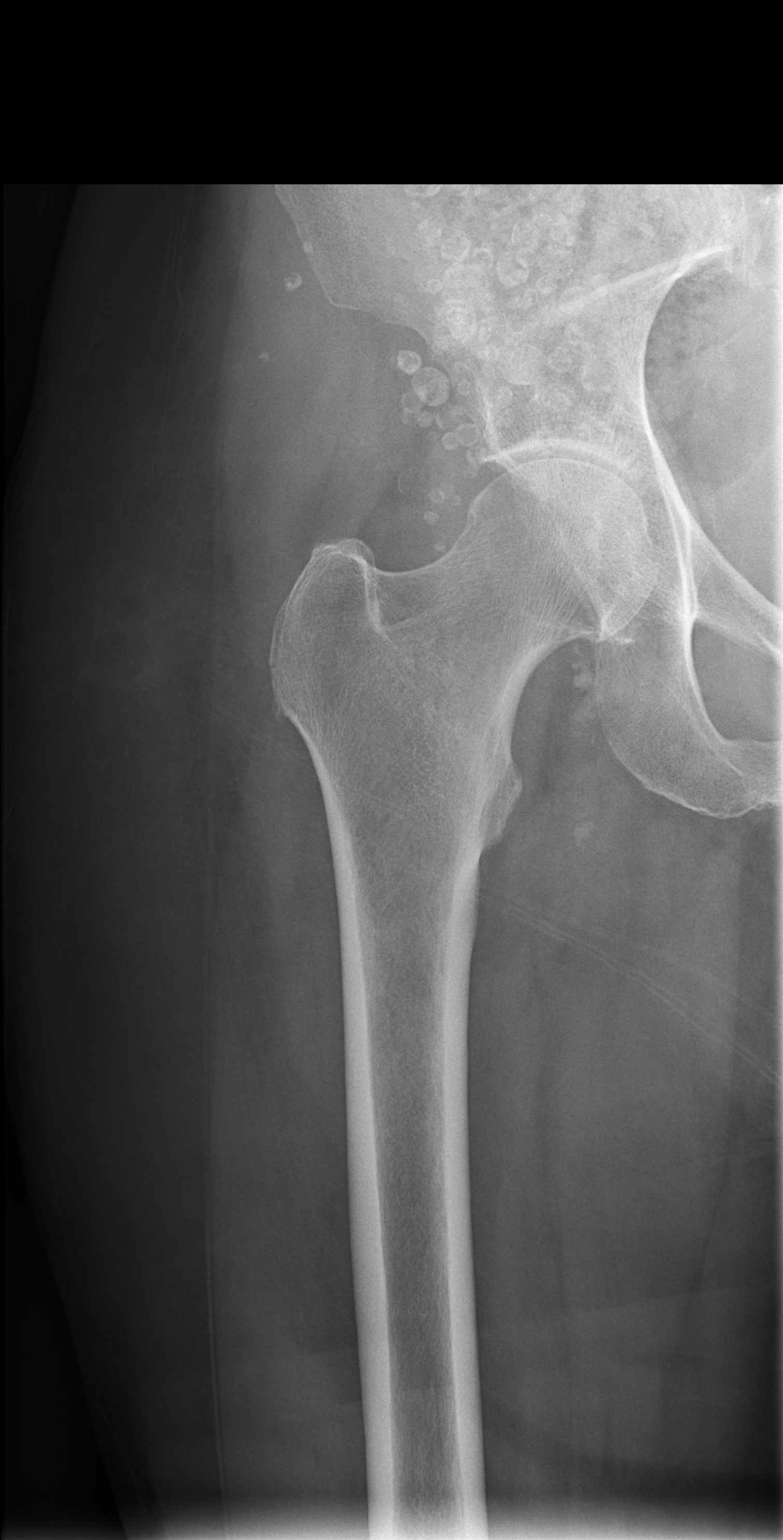
[im 2/2]
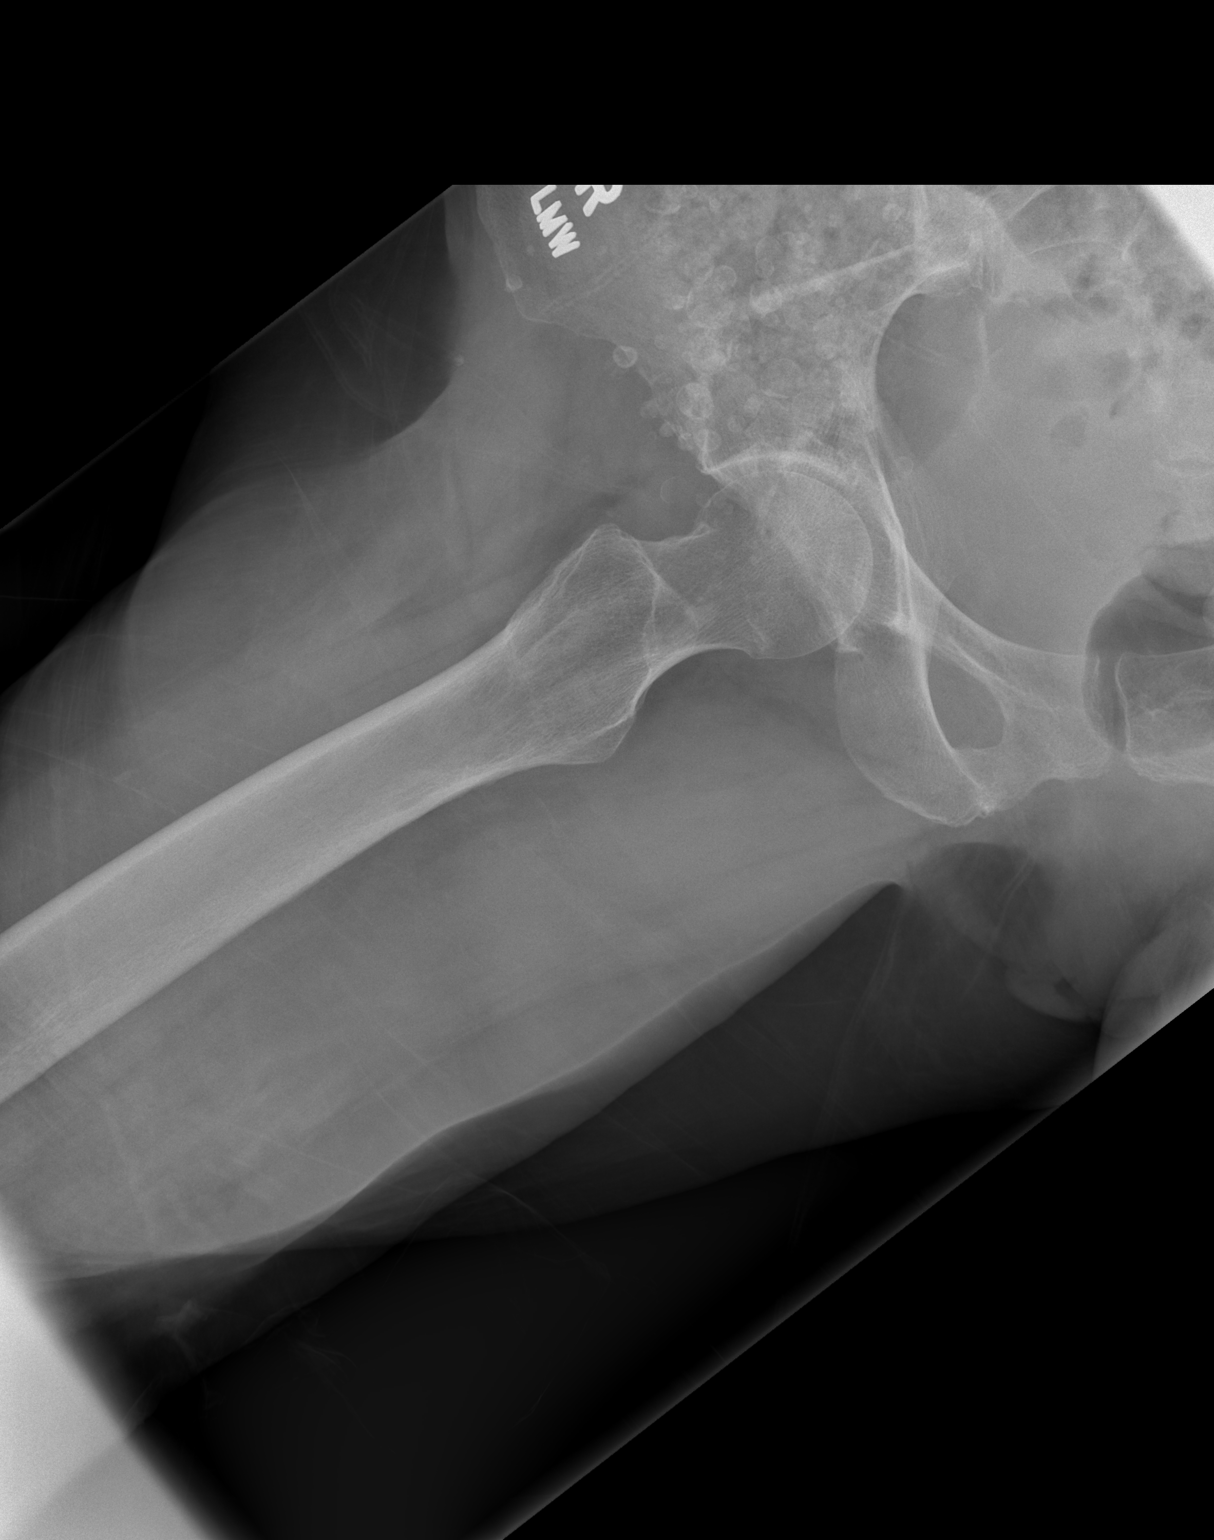

[2 of 2 positions shown; findings below may reference images not displayed]

PROCEDURE:     DXR - DXR HIP RIGHT COMPLETE  - May 01, 2012  [DATE]

RESULT:     AP and lateral views of the right hip reveal the bones to be
osteopenic. There is narrowing of the hip joint space. There is no evidence
of acute fracture nor dislocation. Injection type granulomas are present in
the soft tissues.
IMPRESSION: I see no acute bony abnormality of the right hip. There are
degenerative changes.

[REDACTED]

## 2012-08-25 IMAGING — CT CT CERVICAL SPINE WITHOUT CONTRAST
1 series · 12 of 14 positions shown, 15 images · non-contrast
Comparison: none

REASON FOR EXAM: neck  pain
COMMENTS:

[Series 4: axial · axial · 0.32mm/px · z∈[+136,+300]mm · 12 of 102 slices shown, 15 images]
[im 8/102  soft-tissue]
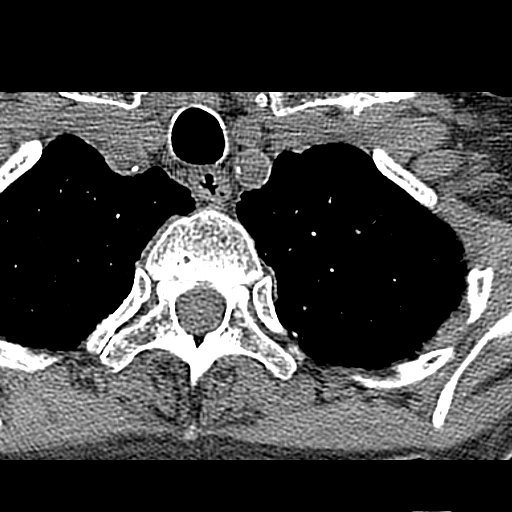
[im 8/102  bone]
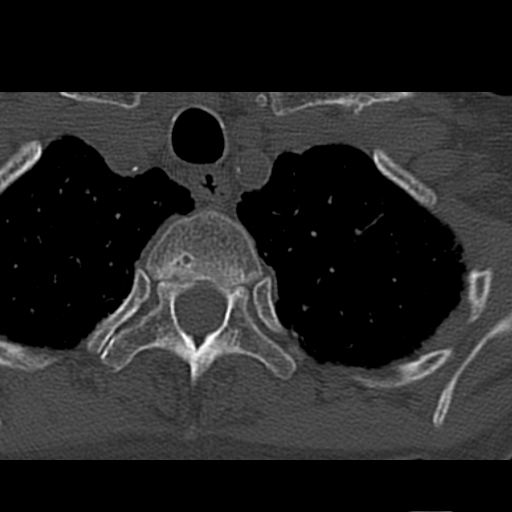
[im 16/102  bone]
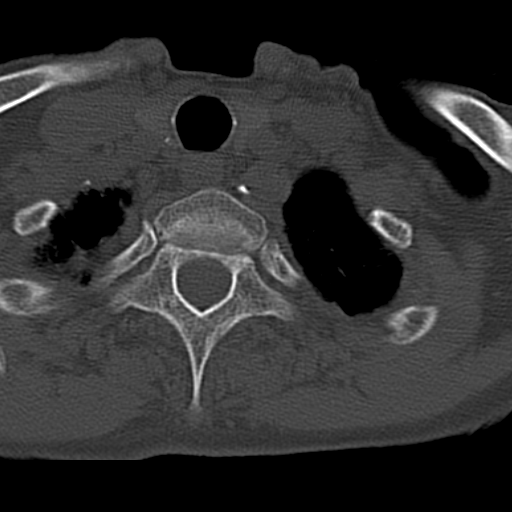
[im 24/102  bone]
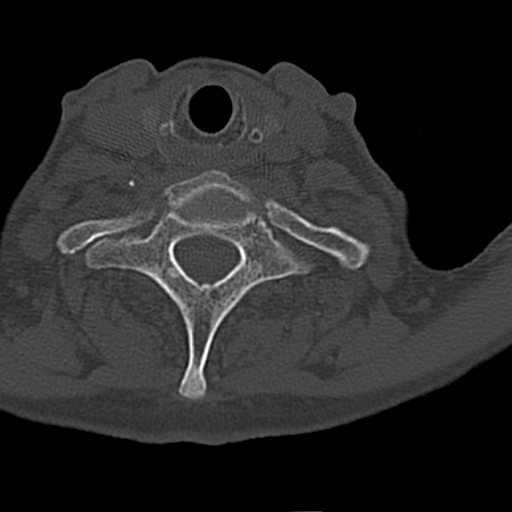
[im 32/102  bone]
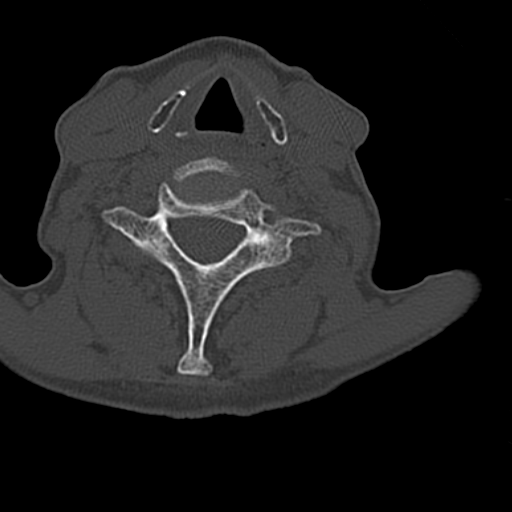
[im 39/102  soft-tissue]
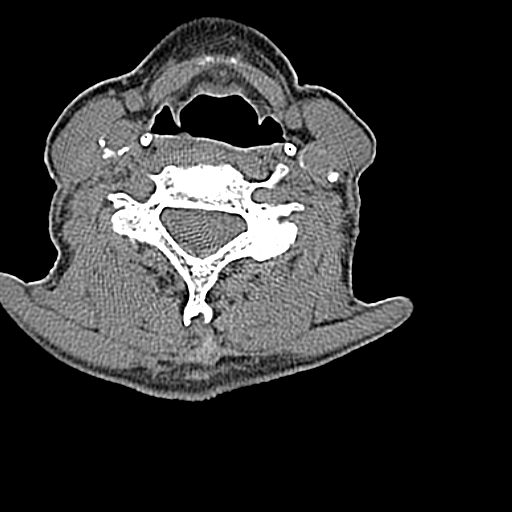
[im 39/102  bone]
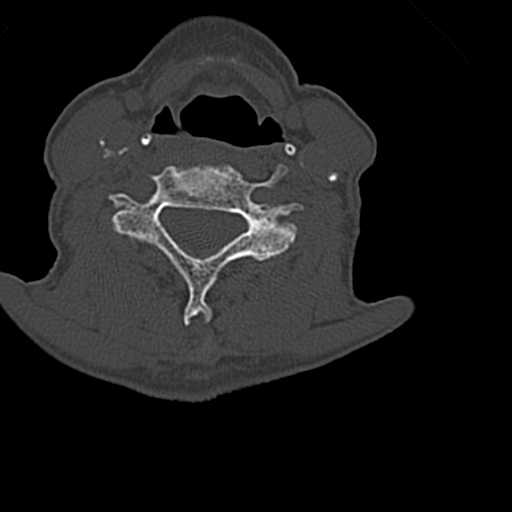
[im 47/102  bone]
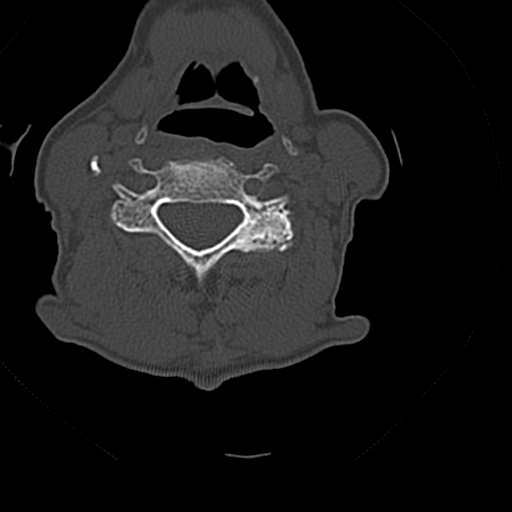
[im 55/102  bone]
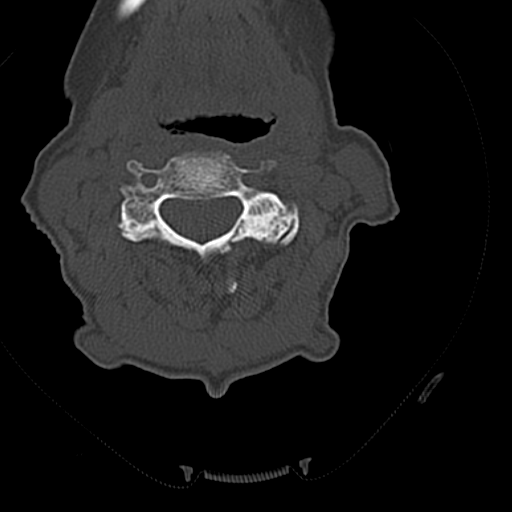
[im 63/102  bone]
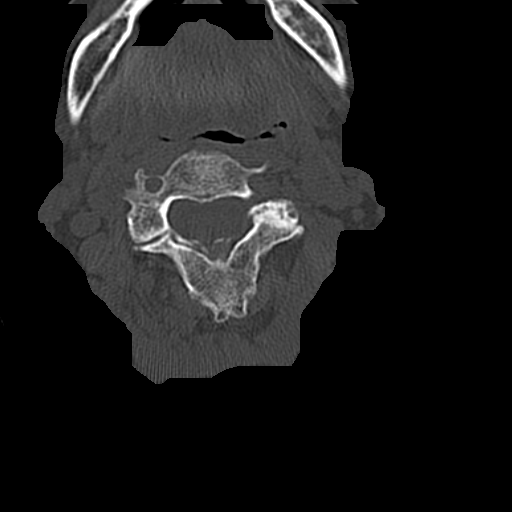
[im 70/102  soft-tissue]
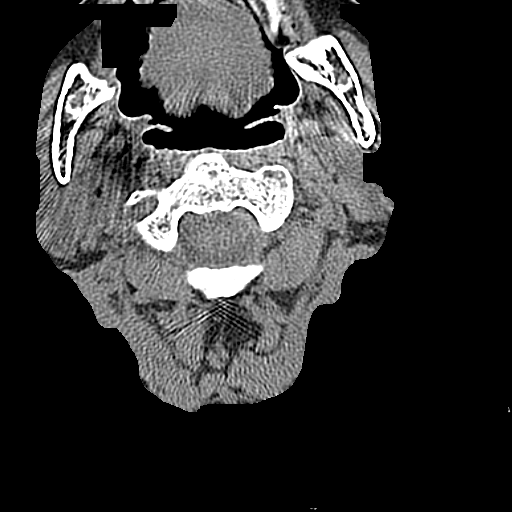
[im 70/102  bone]
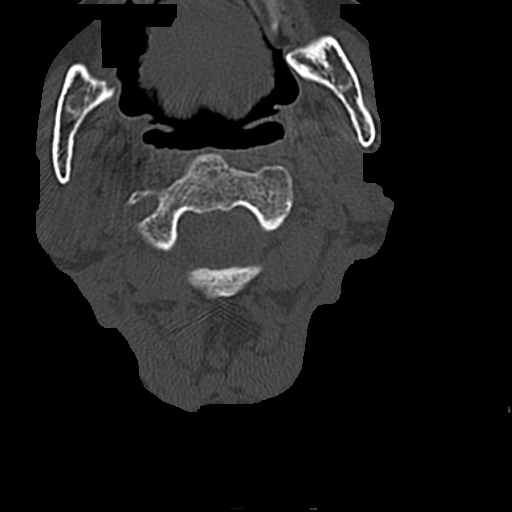
[im 78/102  bone]
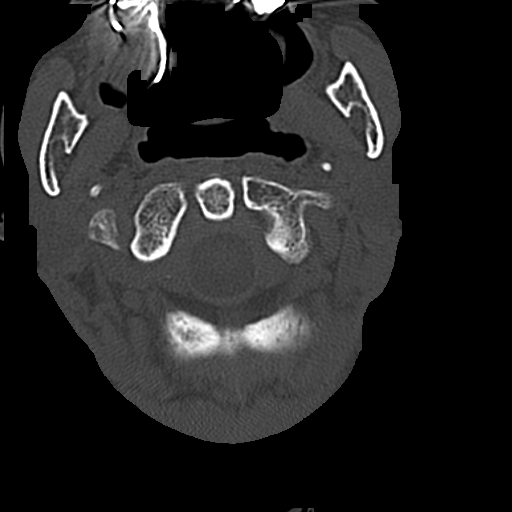
[im 86/102  bone]
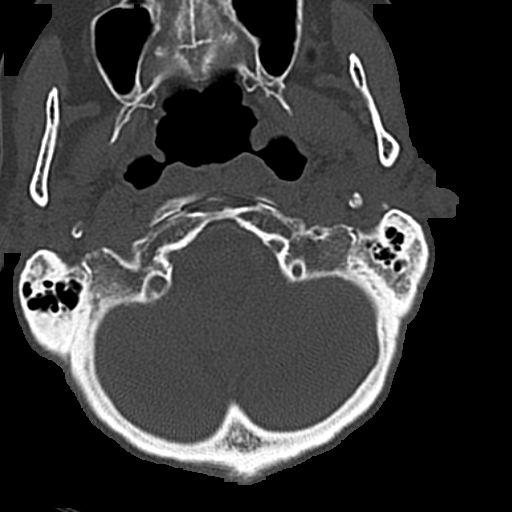
[im 94/102  bone]
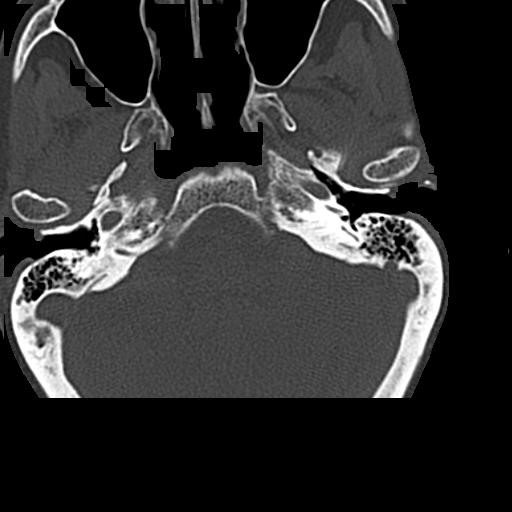

[12 of 14 positions shown; findings below may reference images not displayed]

PROCEDURE:     CT  - CT CERVICAL SPINE WO  - May 01, 2012  [DATE]

RESULT:     Sagittal, axial, and coronal images were obtained through the
cervical spine.

The cervical vertebral bodies are preserved in height. The intervertebral
disc space heights are reasonably well-maintained. The prevertebral soft
tissue spaces appear normal. There is no evidence of a perched facet nor of
a facet fracture. There is degenerative facet joint change on the left at
multiple levels. The bony ring at each cervical level is intact. No bony
spinal canal stenosis is seen. The pulmonary apices exhibit minimal apical
scarring. The odontoid is intact and the lateral masses of C1 align normally
with those of C2.
IMPRESSION: 1. I do not see evidence of an acute cervical spine fracture nor dislocation.
2. There are mild age-related degenerative changes present especially of the
facet joints on the left.
3. Incidental note is made of calcification in the carotid bulbs
bilaterally.

[REDACTED]

## 2012-09-16 ENCOUNTER — Ambulatory Visit: Payer: Self-pay | Admitting: Internal Medicine

## 2012-09-18 ENCOUNTER — Encounter: Payer: Self-pay | Admitting: Internal Medicine

## 2012-09-23 LAB — URINALYSIS, COMPLETE
Bilirubin,UR: NEGATIVE
Glucose,UR: NEGATIVE mg/dL (ref 0–75)
Ketone: NEGATIVE
Ph: 5 (ref 4.5–8.0)
RBC,UR: 48 /HPF (ref 0–5)
WBC UR: 118 /HPF (ref 0–5)

## 2012-10-18 ENCOUNTER — Encounter: Payer: Self-pay | Admitting: Internal Medicine

## 2012-11-18 ENCOUNTER — Encounter: Payer: Self-pay | Admitting: Internal Medicine

## 2012-12-03 LAB — URINALYSIS, COMPLETE
Bilirubin,UR: NEGATIVE
Blood: NEGATIVE
Glucose,UR: NEGATIVE mg/dL (ref 0–75)
Ketone: NEGATIVE
Specific Gravity: 1.015 (ref 1.003–1.030)

## 2012-12-03 LAB — URINE CULTURE

## 2012-12-15 LAB — BASIC METABOLIC PANEL
Chloride: 106 mmol/L (ref 98–107)
Co2: 30 mmol/L (ref 21–32)
Creatinine: 0.84 mg/dL (ref 0.60–1.30)
EGFR (African American): >60
Glucose: 166 mg/dL — ABNORMAL HIGH (ref 65–99)
Potassium: 4.1 mmol/L (ref 3.5–5.1)
Sodium: 143 mmol/L (ref 136–145)

## 2012-12-15 LAB — CBC WITH DIFFERENTIAL/PLATELET
Basophil #: 0.1 10*3/uL (ref 0.0–0.1)
Eosinophil %: 8.5 %
HGB: 14.9 g/dL (ref 12.0–16.0)
Lymphocyte #: 2.1 10*3/uL (ref 1.0–3.6)
Lymphocyte %: 36.7 %
MCH: 28.3 pg (ref 26.0–34.0)
MCHC: 32.8 g/dL (ref 32.0–36.0)
Monocyte #: 0.3 x10 3/mm (ref 0.2–0.9)
Monocyte %: 5.8 %
Neutrophil %: 47.9 %
Platelet: 165 10*3/uL (ref 150–440)
RBC: 5.27 10*6/uL — ABNORMAL HIGH (ref 3.80–5.20)
WBC: 5.6 10*3/uL (ref 3.6–11.0)

## 2012-12-19 ENCOUNTER — Encounter: Payer: Self-pay | Admitting: Internal Medicine

## 2012-12-22 LAB — URINALYSIS, COMPLETE
Glucose,UR: NEGATIVE mg/dL (ref 0–75)
Hyaline Cast: 1
Ketone: NEGATIVE
Leukocyte Esterase: 250
Nitrite: NEGATIVE
Protein: NEGATIVE

## 2012-12-23 LAB — URINE CULTURE

## 2013-01-16 ENCOUNTER — Encounter: Payer: Self-pay | Admitting: Internal Medicine

## 2013-01-28 LAB — URINALYSIS, COMPLETE
Bacteria: NONE SEEN
Blood: NEGATIVE
Ph: 5 (ref 4.5–8.0)
Protein: NEGATIVE
Specific Gravity: 1.024 (ref 1.003–1.030)
Squamous Epithelial: 4
WBC UR: 3 /HPF (ref 0–5)

## 2013-02-16 ENCOUNTER — Encounter: Payer: Self-pay | Admitting: Internal Medicine

## 2013-03-18 ENCOUNTER — Encounter: Payer: Self-pay | Admitting: Internal Medicine

## 2013-04-18 ENCOUNTER — Encounter: Payer: Self-pay | Admitting: Internal Medicine

## 2013-04-23 LAB — URINALYSIS, COMPLETE
Glucose,UR: NEGATIVE mg/dL (ref 0–75)
Ketone: NEGATIVE
RBC,UR: 25 /HPF (ref 0–5)
Specific Gravity: 1.021 (ref 1.003–1.030)

## 2013-04-25 LAB — URINE CULTURE

## 2013-05-18 ENCOUNTER — Encounter: Payer: Self-pay | Admitting: Internal Medicine

## 2013-06-18 ENCOUNTER — Encounter: Payer: Self-pay | Admitting: Internal Medicine

## 2013-07-19 ENCOUNTER — Encounter: Payer: Self-pay | Admitting: Internal Medicine

## 2013-08-18 ENCOUNTER — Encounter: Payer: Self-pay | Admitting: Internal Medicine

## 2013-09-18 ENCOUNTER — Encounter: Payer: Self-pay | Admitting: Internal Medicine

## 2013-09-22 LAB — URINALYSIS, COMPLETE
Blood: NEGATIVE
Hyaline Cast: 20
Ketone: NEGATIVE
Ph: 5 (ref 4.5–8.0)
RBC,UR: 19 /HPF (ref 0–5)
Specific Gravity: 1.02 (ref 1.003–1.030)

## 2013-09-24 LAB — URINE CULTURE

## 2013-10-18 ENCOUNTER — Encounter: Payer: Self-pay | Admitting: Internal Medicine

## 2013-11-16 ENCOUNTER — Observation Stay: Payer: Self-pay | Admitting: Internal Medicine

## 2013-11-16 LAB — CBC WITH DIFFERENTIAL/PLATELET
Eosinophil #: 0.1 10*3/uL (ref 0.0–0.7)
Eosinophil %: 0.7 %
HCT: 44 % (ref 35.0–47.0)
HGB: 14.9 g/dL (ref 12.0–16.0)
Lymphocyte %: 15.9 %
MCH: 29.2 pg (ref 26.0–34.0)
MCHC: 33.8 g/dL (ref 32.0–36.0)
MCV: 87 fL (ref 80–100)
Platelet: 177 10*3/uL (ref 150–440)
RDW: 14 % (ref 11.5–14.5)

## 2013-11-16 LAB — URINALYSIS, COMPLETE
Bilirubin,UR: NEGATIVE
Glucose,UR: NEGATIVE mg/dL (ref 0–75)
Ketone: NEGATIVE
Nitrite: POSITIVE
Ph: 5 (ref 4.5–8.0)
Protein: NEGATIVE
RBC,UR: 1 /HPF (ref 0–5)
WBC UR: 15 /HPF (ref 0–5)

## 2013-11-16 LAB — TROPONIN I: Troponin-I: <0.02 ng/mL

## 2013-11-16 LAB — COMPREHENSIVE METABOLIC PANEL
Albumin: 3.3 g/dL — ABNORMAL LOW (ref 3.4–5.0)
BUN: 32 mg/dL — ABNORMAL HIGH (ref 7–18)
Chloride: 106 mmol/L (ref 98–107)
Co2: 25 mmol/L (ref 21–32)
EGFR (African American): 52 — ABNORMAL LOW
Glucose: 158 mg/dL — ABNORMAL HIGH (ref 65–99)
Osmolality: 282 (ref 275–301)
SGPT (ALT): 18 U/L (ref 12–78)

## 2013-11-17 LAB — CBC WITH DIFFERENTIAL/PLATELET
Basophil #: 0 10*3/uL (ref 0.0–0.1)
Basophil %: 0.5 %
Eosinophil #: 0.4 10*3/uL (ref 0.0–0.7)
Eosinophil %: 5.2 %
HCT: 42.1 % (ref 35.0–47.0)
Lymphocyte #: 1.7 10*3/uL (ref 1.0–3.6)
Lymphocyte %: 24.9 %
MCV: 86 fL (ref 80–100)
Monocyte %: 6.9 %
Neutrophil %: 62.5 %
Platelet: 151 10*3/uL (ref 150–440)

## 2013-11-17 LAB — BASIC METABOLIC PANEL
Anion Gap: 4 — ABNORMAL LOW (ref 7–16)
BUN: 30 mg/dL — ABNORMAL HIGH (ref 7–18)
Chloride: 108 mmol/L — ABNORMAL HIGH (ref 98–107)
Co2: 27 mmol/L (ref 21–32)
Creatinine: 0.85 mg/dL (ref 0.60–1.30)
EGFR (Non-African Amer.): >60
Osmolality: 285 (ref 275–301)
Potassium: 4 mmol/L (ref 3.5–5.1)
Sodium: 139 mmol/L (ref 136–145)

## 2013-11-17 LAB — MAGNESIUM: Magnesium: 1.9 mg/dL

## 2013-11-18 ENCOUNTER — Encounter: Payer: Self-pay | Admitting: Internal Medicine

## 2013-11-18 ENCOUNTER — Ambulatory Visit: Payer: Self-pay | Admitting: Nurse Practitioner

## 2013-11-19 LAB — URINE CULTURE

## 2013-12-19 ENCOUNTER — Encounter: Payer: Self-pay | Admitting: Internal Medicine

## 2013-12-19 ENCOUNTER — Ambulatory Visit: Payer: Self-pay | Admitting: Nurse Practitioner

## 2014-01-16 ENCOUNTER — Encounter: Payer: Self-pay | Admitting: Internal Medicine

## 2014-01-16 ENCOUNTER — Ambulatory Visit: Payer: Self-pay | Admitting: Nurse Practitioner

## 2014-02-16 ENCOUNTER — Encounter: Payer: Self-pay | Admitting: Internal Medicine

## 2014-03-18 ENCOUNTER — Encounter: Payer: Self-pay | Admitting: Internal Medicine

## 2014-03-18 ENCOUNTER — Ambulatory Visit: Payer: Self-pay | Admitting: Nurse Practitioner

## 2014-04-18 ENCOUNTER — Ambulatory Visit
Admit: 2014-04-18 | Disposition: A | Discharge status: Home / Self Care | Payer: Self-pay | Attending: Nurse Practitioner | Admitting: Nurse Practitioner

## 2014-04-18 ENCOUNTER — Encounter: Payer: Self-pay | Admitting: Internal Medicine

## 2014-05-18 ENCOUNTER — Encounter: Payer: Self-pay | Admitting: Internal Medicine

## 2014-06-16 LAB — BASIC METABOLIC PANEL
Anion Gap: 7 (ref 7–16)
BUN: 24 mg/dL — ABNORMAL HIGH (ref 7–18)
CREATININE: 0.76 mg/dL (ref 0.60–1.30)
Calcium, Total: 8.7 mg/dL (ref 8.5–10.1)
Chloride: 111 mmol/L — ABNORMAL HIGH (ref 98–107)
Co2: 24 mmol/L (ref 21–32)
EGFR (Non-African Amer.): >60
Glucose: 77 mg/dL (ref 65–99)
Osmolality: 286 (ref 275–301)
POTASSIUM: 4.3 mmol/L (ref 3.5–5.1)
SODIUM: 142 mmol/L (ref 136–145)

## 2014-06-16 LAB — CBC WITH DIFFERENTIAL/PLATELET
BASOS ABS: 0.1 10*3/uL (ref 0.0–0.1)
BASOS PCT: 1.3 %
Eosinophil #: 0.3 10*3/uL (ref 0.0–0.7)
Eosinophil %: 5.5 %
HCT: 47.5 % — ABNORMAL HIGH (ref 35.0–47.0)
HGB: 15.1 g/dL (ref 12.0–16.0)
Lymphocyte #: 2.1 10*3/uL (ref 1.0–3.6)
Lymphocyte %: 34 %
MCH: 28.7 pg (ref 26.0–34.0)
MCHC: 31.9 g/dL — ABNORMAL LOW (ref 32.0–36.0)
MCV: 90 fL (ref 80–100)
Monocyte #: 0.4 x10 3/mm (ref 0.2–0.9)
Monocyte %: 6.3 %
Neutrophil #: 3.2 10*3/uL (ref 1.4–6.5)
Neutrophil %: 52.9 %
PLATELETS: 141 10*3/uL — AB (ref 150–440)
RBC: 5.28 10*6/uL — ABNORMAL HIGH (ref 3.80–5.20)
RDW: 14.8 % — AB (ref 11.5–14.5)
WBC: 6.1 10*3/uL (ref 3.6–11.0)

## 2014-06-18 ENCOUNTER — Ambulatory Visit
Admit: 2014-06-18 | Disposition: A | Discharge status: Home / Self Care | Payer: Self-pay | Attending: Nurse Practitioner | Admitting: Nurse Practitioner

## 2014-06-18 ENCOUNTER — Encounter: Payer: Self-pay | Admitting: Internal Medicine

## 2014-07-19 ENCOUNTER — Encounter: Payer: Self-pay | Admitting: Internal Medicine

## 2014-07-19 ENCOUNTER — Ambulatory Visit
Admit: 2014-07-19 | Disposition: A | Discharge status: Home / Self Care | Payer: Self-pay | Attending: Nurse Practitioner | Admitting: Nurse Practitioner

## 2014-08-18 ENCOUNTER — Encounter: Payer: Self-pay | Admitting: Internal Medicine

## 2014-09-18 ENCOUNTER — Encounter: Payer: Self-pay | Admitting: Internal Medicine

## 2014-10-18 ENCOUNTER — Encounter: Payer: Self-pay | Admitting: Internal Medicine

## 2014-11-18 ENCOUNTER — Encounter: Payer: Self-pay | Admitting: Internal Medicine

## 2014-12-19 ENCOUNTER — Encounter: Payer: Self-pay | Admitting: Internal Medicine

## 2015-01-17 ENCOUNTER — Emergency Department: Payer: Self-pay | Admitting: Emergency Medicine

## 2015-01-17 ENCOUNTER — Encounter
Admit: 2015-01-17 | Disposition: A | Discharge status: Home / Self Care | Payer: Self-pay | Attending: Internal Medicine | Admitting: Internal Medicine

## 2015-02-17 ENCOUNTER — Encounter
Admit: 2015-02-17 | Disposition: A | Discharge status: Home / Self Care | Payer: Self-pay | Attending: Internal Medicine | Admitting: Internal Medicine

## 2015-03-11 NOTE — Discharge Summary (Signed)
PATIENT NAME:  Jamie Perez, Jamie Perez MR#:  [id number] DATE OF BIRTH:  05/28/1925  DATE OF ADMISSION:  11/17/2013 DATE OF DISCHARGE:  11/19/2013  DISCHARGE DIAGNOSES:  1.  Altered mental status secondary to dementia.  2.  The patient had suspect urinary tract infection that has resolved.  3.  Dementia with mood disorder, paranoia.     DISCHARGE MEDICATIONS:  1.  Fluticasone 50 mcg inhalation 2 sprays daily.  2.  Zoloft 50 mg one and a half tablets daily.  3.  Vitamin C 500 mg p.o. daily.  4.  Vitamin D3 1000 international units 2 tablets once a day.  5.  Mirtazapine 15 mg half tablet once a day.  6.  Clonazepam 0.5 mg half tablet every 8 hours as needed for anxiety.  7.  Peridex mouthwash.  8.  Dulcolax as needed for constipation.  9.  Lamictal 125 mg daily.  10.  Namenda 10 mg p.o. b.i.d.  11.  Lunesta 2 mg at bedtime.  12.  Tylenol Extra Strength 500 mg b.i.d. as needed for pain.  13.  Milk of magnesia 30 mL for constipation.   DIET: Regular consistency. Low-sodium diet.   CONSULTATIONS: Palliative care consult.   HOSPITAL COURSE: [facility] 81 [facility] because of altered mental status with increased confusion. The patient admitted for possible CVA and possible UTI. The patient's FAST score is 7d.  The patient did receive some fluids, initially started on IV antibiotics. Urine culture showed only 80,000 colonies of E. coli but the patient's white count was 9.5.   CT head did not show any acute changes. The patient is afebrile since admission, so the patient did receive IV Azactam while she is here for infection, suspected UTI. She received from December 30 to January 2nd for 3 days.   The patient is demented, not able to give much history. Has episodes of alertness and then goes into dementia. The patient was seen by palliative care, and [facility].    The patient's urine cultures  were not significant so I stopped the antibiotics.   CODE STATUS: DO NOT RESUSCITATE.  The patient usually is alert during the end of the day and eats lunch and dinner. Right now she is awake and eating lunch but not really oriented. The patient admitted for CVA, started on statins and aspirin, but CT head negative and no neurological changes, so I stopped the statins as well. VITAL SIGNS: Today, temperature 96.8. Heart rate is 56. Blood pressure 130/72, sats 96% on room air.   DISCHARGE CONDITION AT THIS TIME: Stable.   TIME SPENT ON DISCHARGE PREPARATION: More than 30 minutes.   ____________________________ Katha HammingSnehalatha Viki Carrera, MD sk:np D: 11/19/2013 13:49:00 ET Perez: 11/19/2013 18:34:29 ET JOB#: [id number]  cc: Katha HammingSnehalatha Keon Benscoter, MD, <Dictator> Katha HammingSNEHALATHA Herley Bernardini MD ELECTRONICALLY SIGNED 12/03/2013 15:01

## 2015-03-11 NOTE — H&P (Signed)
PATIENT NAME:  Jamie Perez, Jamie Perez MR#:  [id number] DATE OF BIRTH:  [birth year]  DATE OF ADMISSION:  11/17/2013  PRIMARY CARE PHYSICIAN: None local.   REFERRING PHYSICIAN: Dr. Silverio Lay.   CHIEF COMPLAINT:  Worsening of mental status.   HISTORY OF PRESENT ILLNESS: The patient is an 81 year old Caucasian female with past medical history of frequent UTIs, dementia, hypertension, depression, insomnia, paranoia, currently residing at [facility], is brought into the ER for worsening of her confusion and baseline mental status. The patient was found to be more lethargic and nursing home staff could not wake her up. The patient was brought into the ER. Also, there was concern for left facial droop. In the ER, they have not witnessed any facial droop. CAT scan of the head was done as there was a concern for left facial droop, which that was negative. The patient's urine is positive for UTI.  We had a hard time waking the patient up, but eventually, during my examination, the patient is opening her eyes and more agitated. She is responding to verbal commands, but very uncooperative and not having to do any physical examination. No family members at bedside. The patient has received IV Aztreonam x 1 in the ER for urinary tract infection. Hospitalist team is requested to admit the patient.   PAST MEDICAL HISTORY: 1.  Hypertension.  2.  Dementia. 3.  Frequent UTIs. 4.  Depression. 5   Paranoia. 6.  Insomnia.   PAST SURGICAL HISTORY: None documented according to the old records.  ALLERGIES:  ASPIRIN, CEFOTAN, PENICILLIN, DARVOCET, NOVACAINE, ACTONEL.  HOME MEDICATIONS: Vitamin D3 1000 international units 2 tablets p.o. once daily,  Zoloft 50 mg 1 and 1/2 tablets once daily, Namenda 10 mg 2 times a day, mirtazapine 50 mg one half tablet once daily, Lunesta 2 mg 1 tablet p.o. once daily, lamotrigine p.o. once daily, fluticasone two sprays each nostril once daily, clonazepam 0.5 mg 1/2 tablet   orally every eight hours as needed.   SOCIAL HISTORY: The patient is a resident of [facility] in the [employer]. According to the old records, no history of drugs, alcohol or smoking.   FAMILY HISTORY: Mother died at age 78. She had history of hyperlipidemia. Father died at age 79, history of blood clot in his brain. The rest of the history was unobtainable as the patient has baseline dementia and more confused.   PHYSICAL EXAMINATION:  VITAL SIGNS: Temperature 98.2, pulse 78, respirations 18, blood pressure 150/48. Pulse oximetry is 96%.  GENERAL APPEARANCE: Not in acute distress. Moderately built and thin-looking Caucasian female.  HEENT: Normocephalic, atraumatic. Pupils are equally reacting to light and accommodation. No scleral icterus. No sinus tenderness. Dry mucous membranes.  NECK: Supple. No JVD. No thyromegaly. She was spontaneously moving neck.  CARDIAC: S1, S2 normal. Regular rate and rhythm.  LUNGS: Clear to auscultation bilaterally. No accessory muscle use and no anterior chest wall tenderness on palpation.  GASTROINTESTINAL: Soft. Bowel sounds are positive in all four quadrants. Nontender, nondistended, nondistended. No hepatosplenomegaly and no masses felt.  NEUROLOGIC: Arousable to verbal commands, but very restless and agitated, was spontaneously moving extremities. Could not elicit cranial nerves as patient was not cooperative, sensory seemed to be intact.   EXTREMITIES: No edema. No cyanosis. No clubbing.  We could not examine sacral area for decubitus ulcer, as the patient was not cooperative.  MUSCULOSKELETAL: No joint effusion or erythema.  PSYCHIATRIC: Mood and affect irritable and agitated.  IMAGING: CAT scan of the head: No acute findings.   CHEST X-RAY: No acute findings.   DATABASE: UA was nitrite positive, leukocyte esterase  2+, color is yellow, hazy in appearance. CBC normal. Troponin less than 0.02. LFTs: Albumin is low at 3.3;  the rest of the  LFTs are normal. BMP: Glucose is elevated at 158, BUN 32. GFR is 45, anion gap 5; the rest of the Chem-8 is normal.    ASSESSMENT AND PLAN: An 81 year old Caucasian female who is residing in the memory unit brought into the ER for worsening of altered mental status, will be admitted with following assessment and plan:  1.  Altered mental status, probably from acute cystitis. We will admit her to hospital. Blood cultures and urine cultures were obtained. IV aztreonam was given in the ER. Will continue that. We will provide her IV fluids as she seemed to be dehydrated.  2.  There was concern for left facial droop at nursing home, which was not noticed in the Emergency Room. We will continue close monitoring and get neuro checks. CT head is negative. We will provide her aspirin and statin.  3.   History of hypertension. Blood pressure is stable. We will continue home medications.  4.   Dementia. Continue Namenda.  5.  Depression. Continue Zoloft.  6.  She is DO NOT RESUSCITATE. Family members are aware that patient is getting admitted to the hospital. They will come to see the patient in the morning.  7.  We will provide her gastrointestinal and deep venous thrombosis prophylaxis.   Total time spent on admission is 45 minutes.    ____________________________ Ramonita LabAruna Jasmeen Fritsch, MD ag:NTS D: 11/16/2013 23:39:59 ET Perez: 11/17/2013 00:17:39 ET JOB#: [id number]  cc: Ramonita LabAruna Deshun Sedivy, MD, <Dictator> PRIMARY CARE PHYSICIAN Ramonita LabARUNA Nastacia Raybuck MD ELECTRONICALLY SIGNED 11/26/2013 7:14

## 2015-03-12 NOTE — Consult Note (Signed)
General Aspect 81 year old female with history of frequent UTIs, dementia, hypertension, depression, insomnia, paranoia who is currently a resident of [facility] memory care unit who presents after having dizziness on standing, fall, syncope.  Cardiology was called for syncope and arrhythmia on arrival.  She fell backwards and hit her head and hip.  Per the family, she has been having dizzy spells associated with falls for several months.  In the ER,she was found to be in junctional rhythm with a heart rate of 30. Patient was placed on pacer pads and given one dose of atropine following which her heart rate came up to the upper 60s. She was hypotensive with a blood pressure of 60/40 initially. She was given IV fluids. Once her heart rate improved so did her blood pressure.   FAmily reports a recent decline in cognitive function over the past few months.  Sun-downing at the nursing home.    Present Illness . SOCIAL HISTORY: Patient is a resident of [facility] in the memory care unit. There is no history of drug, alcohol, or smoking.   FAMILY HISTORY: Mother died in her 79s. She had hyperlipidemia. Father died in his 2s. HE had a blood clot in his brain.   Physical Exam:   GEN cachectic, thin    HEENT pink conjunctivae    NECK supple    RESP normal resp effort  clear BS    CARD Regular rate and rhythm  Murmur    Murmur Systolic    Systolic Murmur Out flow    ABD denies tenderness  soft    LYMPH negative neck    EXTR negative edema    SKIN normal to palpation    NEURO motor/sensory function intact    PSYCH alert, poor insight   Review of Systems:   Subjective/Chief Complaint no complaints    ROS Pt not able to provide ROS    Medications/Allergies Reviewed Medications/Allergies reviewed     HTN:    Osteoporosis:        Admit Diagnosis:   SYMPTOMATIC BRADYCARDIA, HYPOTENSION, DE: 06-Dec-2011, Active, SYMPTOMATIC BRADYCARDIA, HYPOTENSION, DE       Admit Reason:   Dizziness: (780.4) Active, ICD9, Dizziness and giddiness    Cardiac:  18-Jan-13 11:39    CK, Total 95   CPK-MB, Serum 1.4  Routine Hem:  18-Jan-13 11:39    WBC (CBC) 8.6   RBC (CBC) 4.63   Hemoglobin (CBC) 13.6   Hematocrit (CBC) 41.2   Platelet Count (CBC) 190   MCV 89   MCH 29.4   MCHC 33.1   RDW 14.1  Routine Chem:  18-Jan-13 11:39    Glucose, Serum 122   BUN 25   Creatinine (comp) 1.05   Sodium, Serum 145   Potassium, Serum 4.2   Chloride, Serum 106   CO2, Serum 24   Calcium (Total), Serum 9.1  Hepatic:  18-Jan-13 11:39    Bilirubin, Total 0.5   Alkaline Phosphatase 72   SGPT (ALT) 17   SGOT (AST) 25   Total Protein, Serum 6.7   Albumin, Serum 3.6  Routine Chem:  18-Jan-13 11:39    Osmolality (calc) 294   eGFR (African American) >60   eGFR (Non-African American) 53   Anion Gap 15  Cardiac:  18-Jan-13 11:39    Troponin I < 0.02  Routine Chem:  18-Jan-13 11:39    Magnesium, Serum 2.2  Lab:  18-Jan-13 13:00    Lactic Acid, Cardiopulmonary 0.70  EKG:   Interpretation EKG shows junctional rhythm, rate of 56 bpm Follow up EKG shows NSR with rate of 74 bpm, no significant ST or T wave changes   Radiology Results: XRay:    18-Jan-13 11:53, Chest Portable Single View   Chest Portable Single View    REASON FOR EXAM:    sepsis  COMMENTS:       PROCEDURE: DXR - DXR PORTABLE CHEST SINGLE VIEW  - Dec 06 2011 11:53AM     RESULT: Comparison: None    Findings:     Single portable AP chest radiograph is provided.  There is no focal   parenchymal opacity, pleural effusion, or pneumothorax. Normal   cardiomediastinal silhouette. The osseous structures are unremarkable.    IMPRESSION:     No acute disease of the chest.          Verified By: Jennette Banker, M.D., MD  Cardiology:    18-Jan-13 14:32, Echo Doppler   Echo Doppler    Interpretation Summary    Left ventricular systolic function is normal. Ejection Fraction =    >55%. The transmitral spectral Doppler flow pattern is suggestive of   impaired LV relaxation. The left ventricular wall motion is normal.   The right ventricular systolic function is normal. Right ventricular   systolic pressure is normal. Mild valvular aortic stenosis.    Procedure:    A two-dimensional transthoracic echocardiogram with color flow and   Doppler was performed.    The study was completed  in the emergency room.    Left Ventricle    The left ventricle is normal in size.    There is normal left ventricular wall thickness.    Left ventricular systolic function is normal.    Ejection Fraction = >55%.    The transmitral spectral Dopplerflow pattern is suggestive of   impaired LV relaxation.    The left ventricular wall motion is normal.    Right Ventricle    The right ventricle is normal size.    The right ventricular systolic function is normal.    Atria    The left atrial size isnormal.    Right atrial size is normal.    There is no Doppler evidence for an atrial septal defect.    Mitral Valve    The mitral valve leaflets appear normal. There is no evidence of   stenosis, fluttering, or prolapse.    There is no mitral valve stenosis.    There is no mitral regurgitation noted.    Tricuspid Valve    The tricuspid valve is not well visualized, but is grossly normal.    There is trace tricuspid regurgitation.    Right ventricular systolic pressure is normal.    Aortic Valve    No aortic regurgitation is present.    Mild valvular aortic stenosis.    Moderate sclerosis.    Pulmonic Valve    Trace pulmonic valvular regurgitation.    The pulmonic valve leaflets are thin and pliable; valve motion is   normal.    Great Vessels    The aortic root is normal size.    The pulmonary is not well visualized.    Pericardium/Pleural    There is no pleural effusion.    No pericardial effusion.    MMode 2D Measurements and Calculations    RVDd: 2.6 cm    IVSd: 1.4  cm    LVIDd: 3.2 cm    LVIDs: 1.7 cm    LVPWd: 1.1  cm    FS: 48 %    EF(Teich): 80 %    Ao root diam: 3.0 cm    LA dimension: 3.1 cm    LVOT diam: 2.0 cm    Doppler Measurements and Calculations    MV E point: 116 cm/sec    MV A point: 101 cm/sec    MV E/A: 1.1     MV dec time: 0.27 sec    Ao V2 max: 209 cm/sec    Ao max PG: 17 mmHg    Ao V2 mean: 130 cm/sec    Ao mean PG: 8.3 mmHg    Ao V2 VTI: 52 cm    AVA(I,D): 1.7 cm2    AVA(V,D): 1.7 cm2    LV max PG: 5.0 mmHg    LV mean PG: 3.0 mmHg    LV V1 max: 112 cm/sec  LV V1 mean: 82 cm/sec    LV V1 VTI: 29 cm    SV(LVOT): 90 ml    PA V2 max: 99 cm/sec    PA max PG: 4.0 mmHg    TR Max vel: 240 cm/sec    TR Max PG: 23 mmHg    RVSP: 28 mmHg    RAP systole: 5.0 mmHg    Reading Physician: Ida Rogue   Sonographer:Hege, Sonia Side  Interpreting Physician:  Ida Rogue,  electronically signed on   12-06-2011 18:14:01  Requesting Physician: Ida Rogue  CT:    18-Jan-13 12:21, CT Head Without Contrast   CT Head Without Contrast    REASON FOR EXAM:    altered mental status  COMMENTS:       PROCEDURE: CT  - CT HEAD WITHOUT CONTRAST  - Dec 06 2011 12:21PM     RESULT: Comparison:  None    Technique: Multiple axial images from the foramen magnum to the vertex   were obtained without IV contrast.    Findings:    There is no evidence for mass effect, midline shift, or extra-axial fluid   collections. There is no evidence for space-occupying lesion,   intracranial hemorrhage, or cortical-based area of infarction.   Periventricularand subcortical hypoattenuation is consistent with     chronic small vessel ischemic disease.     There is mild soft tissue thickening along the posterior scalp, just to   the right of midline.    The osseous structures are unremarkable.    IMPRESSION:    1. No acute intracranial process.  2. Chronic small vessel ischemic disease.          Verified By: Gregor Hams, M.D., MD     Aspirin: Unknown  Actonel: Unknown  Ceftin: Unknown  PCN: Unknown  Darvon: Unknown  Novocain: Unknown  Vital Signs/Nurse's Notes: **Vital Signs.:   18-Jan-13 18:30   Vital Signs Type Routine   Temperature Temperature (F) 97.3   Celsius 36.2   Temperature Source oral   Pulse Pulse 69   Pulse source per Dinamap   Respirations Respirations 18   Systolic BP Systolic BP 465   Diastolic BP (mmHg) Diastolic BP (mmHg) 66   Mean BP 85   BP Source Dinamap   Pulse Ox % Pulse Ox % 98   Pulse Ox Activity Level  At rest   Oxygen Delivery 2L     Impression 81 year old female with history of frequent UTIs, dementia, hypertension, depression, insomnia, paranoia who is currently a resident of [facility] memory care unit who presents after having dizziness on standing, fall, syncope.  Junctional rhythm on arrival, improved with atropine. Currently holding NSR with rate in the 60s.  A/P; 1) Syncope: Suspect secondary to arrhythmia Uncertain if previous episodes were also from arrhythmia. Suspect sick sinus syndrome. Less likely medications. --Would monitor on tele for now. I have discussed the possible need for a pacer with the family. --Plan is to watch rhythm over the weekend. If pacer needed, would have to be arranged onh Monday Am when [place] EP available/OR time, or transfer to [facility].   2) Dementia: Severe per the family. Rapid decline in past few months. Family has been changing medications for sleep given aggitation at night.   Electronic Signatures: Ida Rogue (MD)  (Signed 18-Jan-13 19:38)  Authored: General Aspect/Present Illness, History and Physical Exam, Review of System, Past Medical History, Health Issues, Home Medications, Labs, EKG , Radiology, Allergies, Vital Signs/Nurse's Notes, Impression/Plan   Last Updated: 18-Jan-13 19:38 by Ida Rogue (MD)

## 2015-03-12 NOTE — Consult Note (Signed)
Chief Complaint:   Subjective/Chief Complaint Remains confused; denies chest pain or shortness of breath   VITAL SIGNS/ANCILLARY NOTES: **Vital Signs.:   20-Jan-13 08:20   Vital Signs Type Routine   Temperature Temperature (F) 98.7   Celsius 37   Temperature Source oral   Pulse Pulse 77   Respirations Respirations 18   Systolic BP Systolic BP 876   Diastolic BP (mmHg) Diastolic BP (mmHg) 62   Mean BP 82   BP Source Dinamap   Pulse Ox % Pulse Ox % 93   Pulse Ox Activity Level  At rest   Oxygen Delivery Room Air/ 21 %   Brief Assessment:   Cardiac Regular  Bradycardic    Respiratory clear BS    Gastrointestinal Normal    Additional Physical Exam HEENT normal ext - no edema Neuro - significant dementia   Routine Hem:  20-Jan-13 04:27    WBC (CBC) 8.0   RBC (CBC) 4.18   Hemoglobin (CBC) 12.2   Hematocrit (CBC) 37.3   Platelet Count (CBC) 106   MCV 89   MCH 29.2   MCHC 32.7   RDW 13.8  Routine Chem:  20-Jan-13 04:27    Glucose, Serum 92   BUN 13   Creatinine (comp) 0.62   Sodium, Serum 140   Potassium, Serum 3.8   Chloride, Serum 106   CO2, Serum 23   Calcium (Total), Serum 7.9   Osmolality (calc) 279   eGFR (African American) >60   eGFR (Non-African American) >60   Anion Gap 11  Routine Hem:  20-Jan-13 04:27    Neutrophil % 76.8   Lymphocyte % 13.8   Monocyte % 7.4   Eosinophil % 1.6   Basophil % 0.4   Neutrophil # 6.2   Lymphocyte # 1.1   Monocyte # 0.6   Eosinophil # 0.1   Basophil # 0.0   Radiology Results: [place]:    19-Jan-13 16:32, US Carotid Doppler Bilateral   US Carotid Doppler Bilateral    REASON FOR EXAM:    syncope  COMMENTS:       PROCEDURE: [place]  - US CAROTID DOPPLER BILATERAL  - Dec 07 2011  4:32PM     RESULT: Carotid color-flow duplex Doppler reveals dense plaque at both   carotid bifurcations. Degree of stenosis is less than 50%. Peak systolic   flow velocity ratio on the right is 1.1, left 1.9. Vertebrals are patent   with  antegrade flow.    IMPRESSION:  No hemodynamically significant stenosis.          Verified By: Osa Craver, M.D., MD   Assessment/Plan:  Assessment/Plan:   Assessment 1) Syncope - Given junctional rhythm on presentation, bradycardia possible as cause. Telemetry reviewed; sinus to sinus bradycardia with slowest HR 42. Continue telemetry. Family continuing to consider aggressiveness of care and whether they would consider pacemaker given severity of patient's dementia. May need outpatient cardionet. 2) Dementia - significant Patient's daughter, Kalyn Hofstra, requests phone call from Dr Rockey Situ in AM if she is not here.   Electronic Signatures: Kirk Ruths (MD)  (Signed 20-Jan-13 12:10)  Authored: Chief Complaint, VITAL SIGNS/ANCILLARY NOTES, Brief Assessment, Lab Results, Radiology Results, Assessment/Plan   Last Updated: 20-Jan-13 12:10 by Kirk Ruths (MD)

## 2015-03-12 NOTE — H&P (Signed)
PATIENT NAME:  Jamie, Perez MR#:  [id number] DATE OF BIRTH:  1925-11-06  DATE OF ADMISSION:  12/06/2011  REFERRING PHYSICIAN: ED physician, Dr. Margarita Grizzle PRIMARY CARE PHYSICIAN: Daughter is not able to tell me who the patient's primary care physician is.    CHIEF COMPLAINT: Dizziness, fall.   HISTORY OF PRESENT ILLNESS: Patient is an 81 year old female with history of frequent UTIs, dementia, hypertension, depression, insomnia, paranoia who is currently a resident of [address] [facility] [place] memory care unit. She is confused, oriented only to self therefore majority of information was obtained from the ED physician and the patient's daughter at bedside. Per history patient has been having dizzy spells associated with falls sometimes for several months. Today patient was in the TV room and she got up and felt dizzy and fell backwards and hit the back of her head therefore she was sent to the ED where she was found to be in narrow complex undetermined rhythm with a heart rate of 30. Patient was placed on pacer pads and given one dose of atropine following which her heart rate came up to the upper 60s. Patient was also hypotensive with a blood pressure of 60/40 initially. She was given IV fluids. Once her heart rate improved so did her blood pressure. The ED physician discussed the case with Dr. Mariah Milling who will evaluate the patient for possible pacemaker.   ALLERGIES: Aspirin, Actonel, Ceftin, penicillin, Darvocet, novocaine.   PAST MEDICAL HISTORY:  1. Frequent UTIs. 2. Dementia. 3. Hypertension. 4. Depression. 5. Paranoia.  6. Insomnia.   PAST SURGICAL HISTORY: None.   MEDICATIONS:  1. Multivitamin 1 tablet daily.  2. Klonopin 0.25 mg in the morning for anxiety, 0.25 mg once a day as needed for anxiety. 3. Stool softener as needed.  4. Aricept 10 mg daily. 5. Ferate 240 mg daily.  6. Flonase two sprays in each nostril daily.  7. Lotrel 10/20 mg, 1 tablet daily. 8. Lunesta 1 mg at bedtime  for insomnia.  9. Metamucil 1 pack in 6 to 8 ounces of fluids daily for constipation. 10. Zoloft 75 mg daily.  11. Tramadol p.r.n.  12. Vitamin C 500 mg daily.  13. Vitamin D 2000 international units daily.  14. Vitamin E 400 international units daily.   SOCIAL HISTORY: Patient is a resident of [facility] in the memory care unit. There is no history of drug, alcohol, or smoking.   FAMILY HISTORY: Mother died in her 63s. She had hyperlipidemia. Father died in his 44s. HE had a blood clot in his brain.  REVIEW OF SYSTEMS: Patient is confused and is unable to provide me a reliable review of systems.   PHYSICAL EXAMINATION:  VITAL SIGNS: Temperature 97.1, heart rate was 43 initially, it is currently 67 on the monitor. The patient is in normal sinus rhythm. Respiratory rate 22, blood pressure 60/40 initially, pulse oximetry 96% on room air.    GENERAL: Patient is an elderly Caucasian female lying in bed. She is confused.   HEAD: Atraumatic, normocephalic.   EYES: No pallor, icterus, or cyanosis. Pupils are equal, round, and reactive to light and accommodation. Extraocular movements intact.   ENT: Wet mucous membranes. No oropharyngeal erythema or thrush.   NECK: Supple. No masses. No JVD. No thyromegaly. No lymphadenopathy.   CHEST WALL: Not using accessory muscles of respiration. No intercostal muscle retractions. No tenderness to palpation.   LUNGS: Bilaterally clear to auscultation. No wheezing, rales, or rhonchi.   CARDIOVASCULAR: S1, S2 regular. There  is a systolic murmur. No rubs or gallops.   ABDOMEN: Soft, nontender, nondistended. No guarding. No rigidity. No organomegaly. Normal bowel sounds.   SKIN: No rashes or lesions.   PERIPHERIES: There is 1+ pedal edema. 1+ dorsalis pedis.    MUSCULOSKELETAL: No cyanosis or clubbing.   NEUROLOGICAL: Patient is drowsy but arousable. She is confused. She follows simple commands. Moves all four extremities.   PSYCH: Unable  to evaluate.   LABORATORY, DIAGNOSTIC AND RADIOLOGICAL DATA: Lactic acid normal. CAT scan of the head shows no acute abnormality. Urinalysis showed no evidence of infection. Chest x-ray showed no abnormalities. CK, troponin are normal. CBC normal. Glucose 122, BUN 25;  rest of complete metabolic panel is normal. Magnesium 2.2.  ASSESSMENT AND PLAN: 81 year old female with history of hypertension, dementia, depression, paranoia admitted with dizziness. Found to be in narrow complex rhythm with bradycardia, heart rate in the 30s. She was also hypotensive given atropine with good response.  1. Symptomatic bradycardia with hypotension. Patient responded to atropine. Currently she is in normal sinus rhythm, heart rate is around 67, blood pressure is also improved. Will admit the patient to telemetry. Will obtain echo and check serial cardiac enzymes. Also check a TSH. Will hold all her antihypertensive medications including heart rate lowering medications. Will obtain a cardiology consultation.  2. Hypertension. This is likely related to patient's symptomatic bradycardia. Will continue with IV fluid hydration.  3. Hyperglycemia, possibly reactive. 4. Mild dehydration with BUN of 25. Will treat with IV fluids.  5. History of frequent urinary tract infections. Patient's urinalysis doesn't  show any evidence of infection at present. 6. Dementia. Will continue patient's Aricept. 7. History of hypertension. Patient was hypotensive on admission. Will hold her antihypertensive medications for the time being.  8. History of insomnia. Will continue Lunesta.  9. History of dementia, paranoia. Will continue Zoloft and p.r.n. Klonopin. 10. Will place on GI and DVT prophylaxis.  11. Discussed CODE STATUS with the patient's daughter. She will discuss with her other sister and decide upon the CODE STATUS.  Reviewed old medical records, discussed with the ED physician, discussed with the patient's daughter the plan of  care and management.   TIME SPENT: 75 minutes.   ____________________________ Darrick MeigsSangeeta Elayne Gruver, MD sp:cms D: 12/06/2011 14:28:28 ET T: 12/06/2011 14:43:03 ET JOB#: [id number]  cc: Darrick MeigsSangeeta Johnette Teigen, MD, <Dictator>  Darrick MeigsSANGEETA Jonisha Kindig MD ELECTRONICALLY SIGNED 12/06/2011 21:33

## 2015-03-12 NOTE — Consult Note (Signed)
Chief Complaint:   Subjective/Chief Complaint Confused; denies chest pain or shortness of breath   VITAL SIGNS/ANCILLARY NOTES: **Vital Signs.:   19-Jan-13 08:42   Vital Signs Type Routine   Temperature Temperature (F) 97.4   Celsius 36.3   Temperature Source axillary   Pulse Pulse 77   Pulse source per Dinamap   Respirations Respirations 18   Systolic BP Systolic BP 725   Diastolic BP (mmHg) Diastolic BP (mmHg) 59   Mean BP 82   BP Source Dinamap   Pulse Ox % Pulse Ox % 96   Pulse Ox Activity Level  At rest   Oxygen Delivery 2L   Brief Assessment:   Cardiac Regular  Bradycardic    Respiratory clear BS    Gastrointestinal Normal    Additional Physical Exam HEENT normal ext - no edema Neuro - significant dementia     Cardiac:  18-Jan-13 11:39    Troponin I < 0.02    19:27    Troponin I < 0.02  19-Jan-13 03:14    CK, Total 149   CPK-MB, Serum 2.3  Routine Hem:  19-Jan-13 03:14    WBC (CBC) 8.9   RBC (CBC) 4.04   Hemoglobin (CBC) 11.8   Hematocrit (CBC) 35.5   Platelet Count (CBC) 128   MCV 88   MCH 29.3   MCHC 33.4   RDW 13.9  Routine Chem:  19-Jan-13 03:14    Glucose, Serum 124   BUN 23   Creatinine (comp) 0.91   Sodium, Serum 146   Potassium, Serum 3.8   Chloride, Serum 109   CO2, Serum 26   Calcium (Total), Serum 7.9   Osmolality (calc) 296   eGFR (African American) >60   eGFR (Non-African American) >60   Anion Gap 11  Cardiac:  19-Jan-13 03:14    Troponin I 0.04  Routine Chem:  19-Jan-13 03:14    Magnesium, Serum 1.8  Routine Hem:  19-Jan-13 03:14    Neutrophil % 77.5   Lymphocyte % 13.7   Monocyte % 8.4   Eosinophil % 0.1   Basophil % 0.3   Neutrophil # 6.9   Lymphocyte # 1.2   Monocyte # 0.7   Eosinophil # 0.0   Basophil # 0.0  Thyroid:  19-Jan-13 03:14    Thyroid Stimulating Hormone 0.751   Radiology Results:  XRay:    18-Jan-13 11:53, Chest Portable Single View   Chest Portable Single View    REASON FOR EXAM:     sepsis  COMMENTS:       PROCEDURE: DXR - DXR PORTABLE CHEST SINGLE VIEW  - Dec 06 2011 11:53AM     RESULT: Comparison: None    Findings:     Single portable AP chest radiograph is provided.  There is no focal   parenchymal opacity, pleural effusion, or pneumothorax. Normal   cardiomediastinal silhouette. The osseous structures are unremarkable.    IMPRESSION:     No acute disease of the chest.          Verified By: Jennette Banker, M.D., MD  Cardiology:    18-Jan-13 14:32, Echo Doppler   Echo Doppler    Interpretation Summary    Left ventricular systolic function is normal. Ejection Fraction =   >55%. The transmitral spectral Doppler flow pattern is suggestive of   impaired LV relaxation. The left ventricular wall motion is normal.   The right ventricular systolic function is normal. Right ventricular   systolic pressure is normal. Mild valvular  aortic stenosis.    Procedure:    A two-dimensional transthoracic echocardiogram with color flow and   Doppler was performed.    The study was completed  in the emergency room.    Left Ventricle    The left ventricle is normal in size.    There is normal left ventricular wall thickness.    Left ventricular systolic function is normal.    Ejection Fraction = >55%.    The transmitral spectral Dopplerflow pattern is suggestive of   impaired LV relaxation.    The left ventricular wall motion is normal.    Right Ventricle    The right ventricle is normal size.    The right ventricular systolic function is normal.    Atria    The left atrial size isnormal.    Right atrial size is normal.    There is no Doppler evidence for an atrial septal defect.    Mitral Valve    The mitral valve leaflets appear normal. There is no evidence of   stenosis, fluttering, or prolapse.    There is no mitral valve stenosis.    There is no mitral regurgitation noted.    Tricuspid Valve    The tricuspid valve is not well visualized, but is  grossly normal.    There is trace tricuspid regurgitation.    Right ventricular systolic pressure is normal.    Aortic Valve    No aortic regurgitation is present.    Mild valvular aortic stenosis.    Moderate sclerosis.    Pulmonic Valve    Trace pulmonic valvular regurgitation.    The pulmonic valve leaflets are thin and pliable; valve motion is   normal.    Great Vessels    The aortic root is normal size.    The pulmonary is not well visualized.    Pericardium/Pleural    There is no pleural effusion.    No pericardial effusion.    MMode 2D Measurements and Calculations    RVDd: 2.6 cm    IVSd: 1.4 cm    LVIDd: 3.2 cm    LVIDs: 1.7 cm    LVPWd: 1.1 cm    FS: 48 %    EF(Teich): 80 %    Ao root diam: 3.0 cm    LA dimension: 3.1 cm    LVOT diam: 2.0 cm    Doppler Measurements and Calculations    MV E point: 116 cm/sec    MV A point: 101 cm/sec    MV E/A: 1.1     MV dec time: 0.27 sec    Ao V2 max: 209 cm/sec    Ao max PG: 17 mmHg    Ao V2 mean: 130 cm/sec    Ao mean PG: 8.3 mmHg    Ao V2 VTI: 52 cm    AVA(I,D): 1.7 cm2    AVA(V,D): 1.7 cm2    LV max PG: 5.0 mmHg    LV mean PG: 3.0 mmHg    LV V1 max: 112 cm/sec  LV V1 mean: 82 cm/sec    LV V1 VTI: 29 cm    SV(LVOT): 90 ml    PA V2 max: 99 cm/sec    PA max PG: 4.0 mmHg    TR Max vel: 240 cm/sec    TR Max PG: 23 mmHg    RVSP: 28 mmHg    RAP systole: 5.0 mmHg    Reading Physician: Ida Rogue   Sonographer:Hege, Sonia Side  Interpreting Physician:  Christia Reading  Rockey Situ,  electronically signed on   12-06-2011 18:14:01  Requesting Physician: Ida Rogue  CT:    18-Jan-13 12:21, CT Head Without Contrast   CT Head Without Contrast    REASON FOR EXAM:    altered mental status  COMMENTS:       PROCEDURE: CT  - CT HEAD WITHOUT CONTRAST  - Dec 06 2011 12:21PM     RESULT: Comparison:  None    Technique: Multiple axial images from the foramen magnum to the vertex   were obtained without IV  contrast.    Findings:    There is no evidence for mass effect, midline shift, or extra-axial fluid   collections. There is no evidence for space-occupying lesion,   intracranial hemorrhage, or cortical-based area of infarction.   Periventricularand subcortical hypoattenuation is consistent with     chronic small vessel ischemic disease.     There is mild soft tissue thickening along the posterior scalp, just to   the right of midline.    The osseous structures are unremarkable.    IMPRESSION:    1. No acute intracranial process.  2. Chronic small vessel ischemic disease.          Verified By: Gregor Hams, M.D., MD   Assessment/Plan:  Assessment/Plan:   Assessment 1) Syncope - Given junctional rhythm on presentation, bradycardia possible as cause. Telemetry reviewed; sinus to sinus bradycardia with slowest HR 45. Continue telemetry. Family considering aggressiveness of care and whether they would consider pacemaker given severity of patient's dementia. May need outpatient cardionet. 2) Dementia - significant   Electronic Signatures: Kirk Ruths (MD)  (Signed 19-Jan-13 11:53)  Authored: Chief Complaint, VITAL SIGNS/ANCILLARY NOTES, Brief Assessment, Lab Results, Radiology Results, Assessment/Plan   Last Updated: 19-Jan-13 11:53 by Kirk Ruths (MD)

## 2015-03-12 NOTE — Discharge Summary (Signed)
PATIENT NAME:  Jamie Perez, Jamie Perez MR#:  [id number] DATE OF BIRTH:  1925-02-07  DATE OF ADMISSION:  12/06/2011 DATE OF DISCHARGE:  12/10/2011  ADMITTING PHYSICIAN: Darrick Meigs, MD  DISCHARGING PHYSICIAN: Enid Baas, MD  PRIMARY CARE PHYSICIAN: Not known. She will be going to [facility] place.   CONSULTANTS: Julien Nordmann, MD - Cardiology.  DISCHARGE DIAGNOSES:  1. Symptomatic bradycardia with dizziness on presentation. She will be discharged on Holter monitor.  2. Dementia.  3. Hypotension on presentation secondary to dehydration.  4. Depression and paranoia. 5. Hypertension.  6. Poor appetite.  7. History of frequent urinary tract infections.  DISCHARGE HOME MEDICATIONS:  1. Lisinopril 20 mg p.o. daily.  2. Remeron 7.5 mg p.o. at bedtime.  3. Klonopin 0.25 mg p.o. every 8 hours p.r.n. for anxiety.  4. ABC Plus Senior oral tablet vitamin supplement once a day.  5. Colace 100 mg p.o. daily.  6. Aricept 10 mg p.o. daily.  7. Ferrous gluconate 240 mg p.o. daily.  8. Flonase 50 mcg nasal spray two sprays to each nostril daily.  9. Lunesta 1 mg p.o. at bedtime.  10. Zoloft 75 mg p.o. daily.  11. Vitamin C 500 mg p.o. daily.  12. Vitamin D3 2000 international units daily.  13. Vitamin E 400 international units daily.  14. Tramadol 50 mg p.o. twice a day p.r.n.   DISCHARGE DIET: Regular diet.   DISCHARGE ACTIVITY: As tolerated.   FOLLOWUP INSTRUCTIONS:  1. Follow up with Dr. Mariah Milling in 1 to 2 weeks.  2. Follow-up with PCP in one week.  3. 48-hour Holter monitor at discharge.  4. Physical therapy.  DISCHARGE LABS/STUDIES: WBC 5.8, hemoglobin 13.1, hematocrit 39.3, platelet count 122.   Sodium 142, potassium 4.0, chloride 104, bicarbonate 29, BUN 9, creatinine 0.64, glucose 96, calcium 8.4.   Hemoglobin A1c 6.0.   Ultrasound Doppler's bilaterally showed no hemodynamically significant stenosis.   TSH is within normal limits at 0.7. Cardiac enzymes have remained  within normal limits. Lactic acid is negative at 0.7.   Blood cultures: Two sets are negative on admission.   2-D echocardiogram showed normal LV systolic function with ejection fraction greater than 55%. Impaired LV relaxation. No wall motion abnormalities. Mild valvular aortic stenosis.   Urinalysis is negative for any infection.   Urine cultures are negative.   Chest x-ray on admission showed no acute disease of the chest.   BRIEF HOSPITAL COURSE: Jamie Perez is an 81 year old elderly Caucasian female with history of progressively worsening dementia, hypertension, depression, and insomnia who came from the [facility] memory care unit and was brought in secondary to having dizzy spells for several months. On the day of admission, she had a fall secondary to dizziness and she was found to have a heart rate of 30 with an indeterminant possible junctional rhythm while in the ED. She was also noted to be hypotensive with blood pressure of 60/40, which improved with IV fluids. So she was admitted for symptomatic bradycardia.  1. Fall and dizziness, probably symptomatic bradycardia along with hypotension. The patient's heart rhythm has mostly been in the 40s and 50s while in the hospital without any dizziness. Jamie Perez blood pressure has improved with fluids and is actually on the hypertensive side. Family had initially debated about a pacemaker and since Jamie Perez heart rate has consistently been in the 50s, sometimes when she is sleeping, with some rates in the 40s, Dr. Mariah Milling has told that he did not feel compelled to put a  pacemaker in Jamie Perez at this time but recommended doing a 48 Holter monitor and will follow up as an outpatient. He discussed the plan with both the patient's daughters and they are in agreement and they do not feel that their mother needs a pacemaker right away. Jamie Perez symptoms could also have been secondary to dehydration and hypotension.  2. History of hypertension presenting as  hypotension, possibly from dehydration and volume depletion. She was given IV fluids. Jamie Perez blood pressure has improved and she has been hypertensive over the last couple of days so she is being discharged on low-dose lisinopril and will be followed up as an outpatient. No rate controlling medications like beta blockers  or calcium channel blockers with Jamie Perez bradycardia are recommended.  3. Progressively worsening dementia with depression and paranoia. The patient is on Aricept and I discussed with the patient's daughter, Ms. Abner GreenspanBeth Shook, and we ordered a MRI of Jamie Perez brain which will be done just to rule out any major masses or massive strokes, but I guess this is all secondary to severe cerebral atrophy, possible chronic small vessel ischemic disease, and worsening dementia. The MRI results are not back, but if there is any significant abnormality she will be contacted. She is on Zoloft for Jamie Perez depression and low dose Remeron is being added to help Jamie Perez sleep and also to improve Jamie Perez appetite. She is on Klonopin p.r.n. for anxiety. She is also on Lunesta for insomnia. Jamie Perez course has been otherwise uneventful in the hospital.   DISCHARGE CONDITION: Stable with guarded prognosis.           DISCHARGE DISPOSITION: To [facility].  TIME SPENT ON DISCHARGE: 45 minutes.  ____________________________ Enid Baasadhika Wilson Dusenbery, MD rk:slb D: 12/10/2011 16:13:57 ET Perez: 12/10/2011 16:47:41 ET JOB#: [id number]  cc: Enid Baasadhika Asheton Scheffler, MD, <Dictator> Antonieta Ibaimothy J. Gollan, MD Enid BaasADHIKA Petrina Melby MD ELECTRONICALLY SIGNED 12/13/2011 14:45

## 2015-06-19 ENCOUNTER — Encounter
Admission: RE | Admit: 2015-06-19 | Discharge: 2015-06-19 | Disposition: A | Discharge status: Home / Self Care | Payer: Medicare PPO | Source: Ambulatory Visit | Attending: Internal Medicine | Admitting: Internal Medicine

## 2015-06-19 DIAGNOSIS — F0390 Unspecified dementia without behavioral disturbance: Secondary | ICD-10-CM | POA: Insufficient documentation

## 2015-06-19 DIAGNOSIS — I10 Essential (primary) hypertension: Secondary | ICD-10-CM | POA: Insufficient documentation

## 2015-06-27 DIAGNOSIS — I10 Essential (primary) hypertension: Secondary | ICD-10-CM | POA: Diagnosis not present

## 2015-06-27 DIAGNOSIS — F0390 Unspecified dementia without behavioral disturbance: Secondary | ICD-10-CM | POA: Diagnosis present

## 2015-06-27 LAB — CBC WITH DIFFERENTIAL/PLATELET
BASOS ABS: 0.1 10*3/uL (ref 0–0.1)
Basophils Relative: 2 %
EOS PCT: 7 %
Eosinophils Absolute: 0.3 10*3/uL (ref 0–0.7)
HCT: 39.9 % (ref 35.0–47.0)
HEMOGLOBIN: 13.3 g/dL (ref 12.0–16.0)
LYMPHS ABS: 1.4 10*3/uL (ref 1.0–3.6)
Lymphocytes Relative: 32 %
MCH: 28.7 pg (ref 26.0–34.0)
MCHC: 33.3 g/dL (ref 32.0–36.0)
MCV: 86 fL (ref 80.0–100.0)
MONO ABS: 0.3 10*3/uL (ref 0.2–0.9)
Monocytes Relative: 8 %
NEUTROS PCT: 51 %
Neutro Abs: 2.3 10*3/uL (ref 1.4–6.5)
PLATELETS: 147 10*3/uL — AB (ref 150–440)
RBC: 4.64 MIL/uL (ref 3.80–5.20)
RDW: 14.6 % — AB (ref 11.5–14.5)
WBC: 4.4 10*3/uL (ref 3.6–11.0)

## 2015-06-27 LAB — COMPREHENSIVE METABOLIC PANEL
ALK PHOS: 60 U/L (ref 38–126)
ALT: 11 U/L — ABNORMAL LOW (ref 14–54)
AST: 14 U/L — AB (ref 15–41)
Albumin: 3.3 g/dL — ABNORMAL LOW (ref 3.5–5.0)
Anion gap: 7 (ref 5–15)
BUN: 23 mg/dL — AB (ref 6–20)
CALCIUM: 8.7 mg/dL — AB (ref 8.9–10.3)
CHLORIDE: 107 mmol/L (ref 101–111)
CO2: 27 mmol/L (ref 22–32)
Creatinine, Ser: 0.74 mg/dL (ref 0.44–1.00)
GFR calc Af Amer: >60 mL/min (ref >60)
GLUCOSE: 86 mg/dL (ref 65–99)
POTASSIUM: 3.7 mmol/L (ref 3.5–5.1)
Sodium: 141 mmol/L (ref 135–145)
Total Bilirubin: 0.3 mg/dL (ref 0.3–1.2)
Total Protein: 6 g/dL — ABNORMAL LOW (ref 6.5–8.1)

## 2015-07-20 ENCOUNTER — Encounter
Admission: RE | Admit: 2015-07-20 | Discharge: 2015-07-20 | Disposition: A | Discharge status: Home / Self Care | Payer: Medicare PPO | Source: Ambulatory Visit | Attending: Internal Medicine | Admitting: Internal Medicine

## 2015-08-19 ENCOUNTER — Encounter
Admission: RE | Admit: 2015-08-19 | Discharge: 2015-08-19 | Disposition: A | Discharge status: Home / Self Care | Payer: BC Managed Care – PPO | Source: Ambulatory Visit | Attending: Internal Medicine | Admitting: Internal Medicine

## 2015-09-19 ENCOUNTER — Encounter
Admission: RE | Admit: 2015-09-19 | Discharge: 2015-09-19 | Disposition: A | Discharge status: Home / Self Care | Payer: BC Managed Care – PPO | Source: Ambulatory Visit | Attending: Internal Medicine | Admitting: Internal Medicine

## 2015-09-20 ENCOUNTER — Emergency Department
Admission: EM | Admit: 2015-09-20 | Discharge: 2015-09-20 | Disposition: A | Discharge status: Home / Self Care | Payer: Medicare PPO | Attending: Emergency Medicine | Admitting: Emergency Medicine

## 2015-09-20 ENCOUNTER — Encounter: Payer: Self-pay | Admitting: Emergency Medicine

## 2015-09-20 DIAGNOSIS — F0390 Unspecified dementia without behavioral disturbance: Secondary | ICD-10-CM | POA: Diagnosis not present

## 2015-09-20 DIAGNOSIS — Z87891 Personal history of nicotine dependence: Secondary | ICD-10-CM | POA: Diagnosis not present

## 2015-09-20 DIAGNOSIS — Z79899 Other long term (current) drug therapy: Secondary | ICD-10-CM | POA: Diagnosis not present

## 2015-09-20 DIAGNOSIS — K623 Rectal prolapse: Secondary | ICD-10-CM | POA: Diagnosis not present

## 2015-09-20 DIAGNOSIS — I10 Essential (primary) hypertension: Secondary | ICD-10-CM | POA: Diagnosis not present

## 2015-09-20 DIAGNOSIS — Z880 Allergy status to penicillin: Secondary | ICD-10-CM | POA: Insufficient documentation

## 2015-09-20 DIAGNOSIS — K6289 Other specified diseases of anus and rectum: Secondary | ICD-10-CM | POA: Diagnosis present

## 2015-09-20 HISTORY — DX: Anxiety disorder, unspecified: F41.9

## 2015-09-20 HISTORY — DX: Cardiac arrhythmia, unspecified: I49.9

## 2015-09-20 HISTORY — DX: Unspecified dementia, unspecified severity, without behavioral disturbance, psychotic disturbance, mood disturbance, and anxiety: F03.90

## 2015-09-20 HISTORY — DX: Bradycardia, unspecified: R00.1

## 2015-09-20 HISTORY — DX: Essential (primary) hypertension: I10

## 2015-09-20 HISTORY — DX: Age-related osteoporosis without current pathological fracture: M81.0

## 2015-09-20 HISTORY — DX: Insomnia, unspecified: G47.00

## 2015-09-20 NOTE — ED Provider Notes (Signed)
[facility] Emergency Department Provider Note  ____________________________________________  Time seen: 2125  I have reviewed the triage vital signs and the nursing notes.  Level 5 Caveat - patient with advanced dementia and not communicative. History is limited and review of systems is not possible.  HISTORY  Chief Complaint Rectal Problems     HPI Jamie Perez is a 81 y.o. female who stays at the [facility] [place]. She has been brought to the emergency department by EMS because the nursing staff noticed that she had something protruding from her rectum. The nursing staff at the [facility] [place] apparently told EMS that they "thinks she has had it before".  On arrival in the emergency department, the patient keeps her eyes closed and is nonverbal and provides no history.   Past Medical History  Diagnosis Date  . Dementia   . Anxiety   . Bradycardia   . Hypertension   . Osteoporosis   . Insomnia   . Dysrhythmia, cardiac     Patient Active Problem List   Diagnosis Date Noted  . Bradycardia 12/24/2011  . Insomnia 10/30/2011  . Dementia 10/30/2011    History reviewed. No pertinent past surgical history.  Current Outpatient Rx  Name  Route  Sig  Dispense  Refill  . Cholecalciferol (VITAMIN D) 2000 UNITS CAPS   Oral   Take by mouth daily.           . clonazePAM (KLONOPIN) 0.5 MG tablet   Oral   Take 0.25 mg by mouth 2 (two) times daily as needed.           Marland Kitchen DOCUSATE SODIUM PO   Oral   Take by mouth daily.           Marland Kitchen donepezil (ARICEPT) 10 MG tablet   Oral   Take 1 tablet (10 mg total) by mouth daily.   30 tablet   6   . eszopiclone (LUNESTA) 1 MG TABS   Oral   Take 1 mg by mouth at bedtime as needed. Take immediately before bedtime         . Ferrous Gluconate (IRON) 240 (27 FE) MG TABS   Oral   Take by mouth daily.           . fluticasone (FLONASE) 50 MCG/ACT nasal spray   Nasal   Place 2 sprays into the  nose.           Marland Kitchen EXPIRED: lamoTRIgine (LAMICTAL) 25 MG tablet   Oral   Take 50 mg by mouth daily.         Marland Kitchen EXPIRED: lisinopril (PRINIVIL,ZESTRIL) 10 MG tablet   Oral   Take 1 tablet (10 mg total) by mouth daily.   90 tablet   3   . EXPIRED: mirtazapine (REMERON) 7.5 MG tablet   Oral   Take 1 tablet (7.5 mg total) by mouth at bedtime.   30 tablet   3   . Multiple Vitamin (MULTIVITAMIN) tablet   Oral   Take 1 tablet by mouth daily.           . psyllium (METAMUCIL) 58.6 % powder   Oral   Take 1 packet by mouth. Mixed with 6-8 fluid ounces daily          . sertraline (ZOLOFT) 50 MG tablet   Oral   Take 1.5 tablets (75 mg total) by mouth daily.   45 tablet   3   . traMADol (ULTRAM) 50 MG  tablet   Oral   Take 50 mg by mouth 2 (two) times daily as needed.           . vitamin C (ASCORBIC ACID) 500 MG tablet   Oral   Take 500 mg by mouth daily.           . vitamin E 400 UNIT capsule   Oral   Take 400 Units by mouth daily.             Allergies Aspirin; Cefuroxime axetil; Darvon; Penicillins; Procaine hcl; and Risedronate sodium  No family history on file.  Social History Social History  Substance Use Topics  . Smoking status: Former Games developer  . Smokeless tobacco: None  . Alcohol Use: No    Review of Systems Is not possible given the patient's level of communication ____________________________________________   PHYSICAL EXAM:  VITAL SIGNS: ED Triage Vitals  Enc Vitals Group     BP --      Pulse --      Resp --      Temp --      Temp src --      SpO2 --      Weight --      Height --      Head Cir --      Peak Flow --      Pain Score --      Pain Loc --      Pain Edu? --      Excl. in GC? --     Constitutional:  Eyes closed, does not respond to voice, but does respond to contact and exam. She puts up some minimal resistance to examination and does utter some words. She does not appear to be any acute distress. ENT   Head:  Normocephalic and atraumatic. Cardiovascular: Normal rate, regular rhythm, no murmur noted Respiratory:  Normal respiratory effort, no tachypnea.    Breath sounds are clear and equal bilaterally.  Gastrointestinal: Soft and nontender. No distention.  Rectal: The patient has a very large amount of tissue protruding from her rectum consistent with a rectal prolapse. The tissue appears slightly moist, pink, and overall normal in appearance. Musculoskeletal: No deformity noted.  Patient does use her hands to resist exam and moved some with some degree of coordination. Neurologic:  Nonverbal, confused, noncommunicative. Limited neurologic exam.  Skin:  Skin is warm, dry. No rash noted. Psychiatric: Nonverbal, resistant to exam, not oriented. ____________________________________________   ____________________________________________   PROCEDURES  Reduction of prolapsed rectum: The patient was lying in the bed left lateral recumbent. The large amount of rectal tissue protruding from the anus was assessed. It was moist, but I irrigated the surface with normal saline as well. I then applied water soluble lubricant to the tubular structure and placed steady pressure upwards. The rectal prolapse was overall fairly easily reduced.  ____________________________________________   INITIAL IMPRESSION / ASSESSMENT AND PLAN / ED COURSE  Pertinent labs & imaging results that were available during my care of the patient were reviewed by me and considered in my medical decision making (see chart for details).  81 year old female with dementia with a rectal prolapse. We are able to reduce the rectal prolapse here in the emergency department. After approximately 20 minutes, I reexamined the rectal area to ensure that it had not prolapsed again. The rectum had not. At this time, there is no indication for surgical intervention. We will return her to [facility] [place] for her ongoing  care.  -----------------------------------------  10:33 PM on 09/20/2015 -----------------------------------------  The patient has been resting comfortably. On reexamination, there has been no repeat prolapse of the rectum. We will allow [facility] to [facility] back [facility].  ____________________________________________   FINAL CLINICAL IMPRESSION(S) / ED DIAGNOSES  Final diagnoses:  Rectal prolapse  Dementia, without behavioral disturbance       Darien Ramusavid W Kallen Mccrystal, MD 09/20/15 2234

## 2015-09-20 NOTE — ED Notes (Signed)
[facility] EMS c/o prolapsed rectum. Per EMS, nursing staff reports they "think she has had it before." Staff reports noticing prolapse rectum tonight. Pt has hx of dementia and is nonverbal per EMS.

## 2015-09-20 NOTE — ED Notes (Addendum)
EMS being called to discharge [facility]. Rectum has not re-prolapsed. Pt resting comfortably at this time. Vitals WNL.

## 2015-09-20 NOTE — Discharge Instructions (Signed)
The rectum was prolapsed through the anus. It was fairly easily reduced.  If the rectum protrudes out again and there is someone who is comfortable putting gentle pressure on the tubular structure to push it back up inside, this would be fine. If this is not possible, return the patient to the emergency department for this procedure and further evaluation. Follow-up with her regular physician to discuss whether any intervention should be performed to prevent further prolapse.  Rectal Prolapse, Adult Rectal prolapse happens when the inside of the final section of the large intestine (rectum) pushes out through the anal opening. With this condition, the lower part of the rectum turns inside out. At first, rectal prolapse may be temporary. It may happen only when you are having a bowel movement. Over time, the prolapse will likely get worse. It may start to happen more often and cause uncomfortable symptoms. Eventually, the prolapse may happen when you are walking or simply standing. Surgery is often needed for this condition. CAUSES  This condition may result from weakness of the muscles that attach the rectum to the inside of the lower abdomen. The exact cause of this muscle weakness is not known.  RISK FACTORS This condition is more likely to develop in:  Women who are 250 years of age or older.  People with a history of constipation.  People with a history of hemorrhoids.  People who have a lower spinal cord injury.  Women who have been pregnant many times.  People who have had rectal surgery.  Men who have an enlarged prostate gland.  People who have chronic obstructive pulmonary disease (COPD).  People who have cystic fibrosis. SYMPTOMS The main symptom of this condition is a red bump of tissue sticking out from your anus. At first, the bump may only appear after a bowel movement. It may then start to appear more often. Other symptoms may include:  Discomfort in the anus and  rectum.  Constipation.  Diarrhea.  Inability to control bowel movements (incontinence).  Rectal bleeding. DIAGNOSIS  This condition may be diagnosed based on your symptoms and a physical exam. During the exam, you may be asked to squat and strain as though you are having a bowel movement. You may also have tests, such as:  A rectal exam using a flexible scope (sigmoidoscopy or colonoscopy).  A procedure that involves taking X-rays of your rectum after a dye (contrast material) is injected into the rectum (defecogram). TREATMENT  This condition is usually treated with surgery to repair the weakened muscles and to reconnect the rectum to attachments inside the lower abdomen. Other treatment options may include:  Pushing the prolapsed area back into the rectum (reduction). Your health care provider may do this by gently pushing it back in using a moist cloth. The health care provider may also show you how to do this at home if the prolapse occurs again.  Medicines to prevent constipation and straining. This may include laxatives or stool softeners. HOME CARE INSTRUCTIONS General Instructions  Take over-the-counter and prescription medicines only as told by your health care provider.  Do not strain to have a bowel movement.  Do not lift anything that is heavier than 10 lb (4.5 kg).  Follow instructions from your health care provider about what to do if the prolapse occurs again and does not go back in. This may involve lying on your side and using a moist cloth to gently press the lump into your rectum.  Keep all follow-up visits  as told by your health care provider. This is important. Preventing Constipation  Eat foods that have a lot of fiber, such as fruits, vegetables, whole grains, and beans.  Limit foods high in fat and processed sugars, such as french fries, hamburgers, cookies, candies, and soda.  Drink enough fluids to keep your urine clear or pale yellow. SEEK MEDICAL  CARE IF:  You have a fever.  Your prolapse cannot be reduced at home.  You have constipation or diarrhea.  You have mild rectal bleeding. SEEK IMMEDIATE MEDICAL CARE IF:  You have very bad rectal pain.  You bleed heavily from your rectum.   This information is not intended to replace advice given to you by your health care provider. Make sure you discuss any questions you have with your health care provider.   Document Released: 07/26/2015 Document Reviewed: 11/23/2014 Elsevier Interactive Patient Education [employer]

## 2015-09-20 NOTE — ED Notes (Signed)
Report given to Misty StanleyStacey, [facility] fund[facility] at [facility].

## 2015-09-20 NOTE — ED Notes (Signed)
Dr. Carollee MassedKaminski was successful with 1st attempt at reinserting the pts prolapsed rectum. Will monitor pt for re-prolapse. Pt tolerated well.

## 2015-10-19 ENCOUNTER — Encounter
Admission: RE | Admit: 2015-10-19 | Discharge: 2015-10-19 | Disposition: A | Discharge status: Home / Self Care | Payer: Medicare PPO | Source: Ambulatory Visit | Attending: Internal Medicine | Admitting: Internal Medicine

## 2015-11-19 ENCOUNTER — Encounter
Admission: RE | Admit: 2015-11-19 | Discharge: 2015-11-19 | Disposition: A | Discharge status: Home / Self Care | Payer: Medicare PPO | Source: Ambulatory Visit | Attending: Internal Medicine | Admitting: Internal Medicine

## 2015-12-20 ENCOUNTER — Encounter
Admission: RE | Admit: 2015-12-20 | Discharge: 2015-12-20 | Disposition: A | Discharge status: Home / Self Care | Payer: Medicare PPO | Source: Ambulatory Visit | Attending: Internal Medicine | Admitting: Internal Medicine

## 2015-12-25 ENCOUNTER — Emergency Department: Payer: Medicare Other

## 2015-12-25 ENCOUNTER — Emergency Department
Admission: EM | Admit: 2015-12-25 | Discharge: 2015-12-25 | Disposition: A | Discharge status: Home / Self Care | Payer: Medicare Other | Attending: Emergency Medicine | Admitting: Emergency Medicine

## 2015-12-25 ENCOUNTER — Encounter: Payer: Self-pay | Admitting: Emergency Medicine

## 2015-12-25 DIAGNOSIS — S0083XA Contusion of other part of head, initial encounter: Secondary | ICD-10-CM | POA: Insufficient documentation

## 2015-12-25 DIAGNOSIS — Y92128 Other place in nursing home as the place of occurrence of the external cause: Secondary | ICD-10-CM | POA: Diagnosis not present

## 2015-12-25 DIAGNOSIS — Z7951 Long term (current) use of inhaled steroids: Secondary | ICD-10-CM | POA: Diagnosis not present

## 2015-12-25 DIAGNOSIS — Z87891 Personal history of nicotine dependence: Secondary | ICD-10-CM | POA: Insufficient documentation

## 2015-12-25 DIAGNOSIS — Y998 Other external cause status: Secondary | ICD-10-CM | POA: Insufficient documentation

## 2015-12-25 DIAGNOSIS — I10 Essential (primary) hypertension: Secondary | ICD-10-CM | POA: Insufficient documentation

## 2015-12-25 DIAGNOSIS — F0390 Unspecified dementia without behavioral disturbance: Secondary | ICD-10-CM | POA: Insufficient documentation

## 2015-12-25 DIAGNOSIS — Y9389 Activity, other specified: Secondary | ICD-10-CM | POA: Diagnosis not present

## 2015-12-25 DIAGNOSIS — W19XXXA Unspecified fall, initial encounter: Secondary | ICD-10-CM

## 2015-12-25 DIAGNOSIS — W050XXA Fall from non-moving wheelchair, initial encounter: Secondary | ICD-10-CM | POA: Insufficient documentation

## 2015-12-25 DIAGNOSIS — S0990XA Unspecified injury of head, initial encounter: Secondary | ICD-10-CM | POA: Diagnosis present

## 2015-12-25 DIAGNOSIS — Z79899 Other long term (current) drug therapy: Secondary | ICD-10-CM | POA: Diagnosis not present

## 2015-12-25 DIAGNOSIS — Z880 Allergy status to penicillin: Secondary | ICD-10-CM | POA: Insufficient documentation

## 2015-12-25 DIAGNOSIS — T148XXA Other injury of unspecified body region, initial encounter: Secondary | ICD-10-CM

## 2015-12-25 NOTE — ED Notes (Signed)
Pt to ED via EMS from [facility] after fall tonight.  Per EMS patient had witnessed fall from wheelchair tonight at dinner and hit head on floor.  Pt has hx of dementia.  Pt not taking blood thinners per paperwork.  Pt has hematoma to right forehead.

## 2015-12-25 NOTE — ED Notes (Signed)
Attempted to call [facility] place with no answer.

## 2015-12-25 NOTE — Discharge Instructions (Signed)

## 2015-12-25 NOTE — ED Notes (Signed)
Pt waiting for EMS transport back to [place].

## 2015-12-25 NOTE — ED Provider Notes (Signed)
[facility] Emergency Department Provider Note  ____________________________________________  Time seen: Approximately 7:26 PM  I have reviewed the triage vital signs and the nursing notes.   HISTORY  Chief Complaint Fall  History limited by dementia  HPI ODESSER TOURANGEAU is a 82 y.o. female who is reportedly at the nursing home in a wheelchair just fell forward out of the wheelchair hit her head. No apparent loss of consciousness but she does have a large prominent hematoma with a bruise on it on the right forehead. Patient has no other complaints. As near as I am able to acertain patient is at her baseline mental status   Past Medical History  Diagnosis Date  . Dementia   . Anxiety   . Bradycardia   . Hypertension   . Osteoporosis   . Insomnia   . Dysrhythmia, cardiac     Patient Active Problem List   Diagnosis Date Noted  . Bradycardia 12/24/2011  . Insomnia 10/30/2011  . Dementia 10/30/2011    History reviewed. No pertinent past surgical history.  Current Outpatient Rx  Name  Route  Sig  Dispense  Refill  . Cholecalciferol (VITAMIN D) 2000 UNITS CAPS   Oral   Take by mouth daily.           . clonazePAM (KLONOPIN) 0.5 MG tablet   Oral   Take 0.25 mg by mouth 2 (two) times daily as needed.           Marland Kitchen DOCUSATE SODIUM PO   Oral   Take by mouth daily.           Marland Kitchen donepezil (ARICEPT) 10 MG tablet   Oral   Take 1 tablet (10 mg total) by mouth daily.   30 tablet   6   . eszopiclone (LUNESTA) 1 MG TABS   Oral   Take 1 mg by mouth at bedtime as needed. Take immediately before bedtime         . Ferrous Gluconate (IRON) 240 (27 FE) MG TABS   Oral   Take by mouth daily.           . fluticasone (FLONASE) 50 MCG/ACT nasal spray   Nasal   Place 2 sprays into the nose.           Marland Kitchen EXPIRED: lamoTRIgine (LAMICTAL) 25 MG tablet   Oral   Take 50 mg by mouth daily.         Marland Kitchen EXPIRED: lisinopril (PRINIVIL,ZESTRIL) 10 MG  tablet   Oral   Take 1 tablet (10 mg total) by mouth daily.   90 tablet   3   . EXPIRED: mirtazapine (REMERON) 7.5 MG tablet   Oral   Take 1 tablet (7.5 mg total) by mouth at bedtime.   30 tablet   3   . Multiple Vitamin (MULTIVITAMIN) tablet   Oral   Take 1 tablet by mouth daily.           . psyllium (METAMUCIL) 58.6 % powder   Oral   Take 1 packet by mouth. Mixed with 6-8 fluid ounces daily          . sertraline (ZOLOFT) 50 MG tablet   Oral   Take 1.5 tablets (75 mg total) by mouth daily.   45 tablet   3   . traMADol (ULTRAM) 50 MG tablet   Oral   Take 50 mg by mouth 2 (two) times daily as needed.           Marland Kitchen  vitamin C (ASCORBIC ACID) 500 MG tablet   Oral   Take 500 mg by mouth daily.           . vitamin E 400 UNIT capsule   Oral   Take 400 Units by mouth daily.             Allergies Aspirin; Cefuroxime axetil; Darvon; Penicillins; Procaine hcl; and Risedronate sodium  History reviewed. No pertinent family history.  Social History Social History  Substance Use Topics  . Smoking status: Former Games developer  . Smokeless tobacco: None  . Alcohol Use: No    Review of Systems Unable to obtain due to mental status  ____________________________________________   PHYSICAL EXAM:  VITAL SIGNS: ED Triage Vitals  Enc Vitals Group     BP 12/25/15 1859 177/52 mmHg     Pulse Rate 12/25/15 1859 57     Resp 12/25/15 1859 16     Temp 12/25/15 1859 98.1 F (36.7 C)     Temp Source 12/25/15 1859 Axillary     SpO2 12/25/15 1859 95 %     Weight 12/25/15 1859 126 lb (57.153 kg)     Height 12/25/15 1859  (1.651 m)     Head Cir --      Peak Flow --      Pain Score --      Pain Loc --      Pain Edu? --      Excl. in GC? --     Constitutional: Alert  Well appearing and in no acute distress. Eyes: Conjunctivae are normal. PERRL. EOMI. Head: Atraumatic. Except for the previously described hematoma on the right forehead proximal to the size of a $0.50  piece. Nose: No congestion/rhinnorhea. Mouth/Throat: Mucous membranes are moist.  Oropharynx non-erythematous. Neck: No stridor.No cervical spine tenderness to palpation. Cardiovascular: Normal rate, regular rhythm. Grossly normal heart sounds.  Good peripheral circulation. Respiratory: Normal respiratory effort.  No retractions. Lungs CTAB. No chest wall tenderness Gastrointestinal: Soft and nontender. No distention. No abdominal bruits. No CVA tenderness. Musculoskeletal: No lower extremity tenderness nor edema.  No joint effusions. No upper extremity tenderness no spine tenderness Neurologic:  Normal speech and language. No gross focal neurologic deficits are appreciated.  Skin:  Skin is warm, dry and intact. No rash noted. Psychiatric: Mood and affect are normal. Speech and behavior are normal.  ____________________________________________   LABS (all labs ordered are listed, but only abnormal results are displayed)  Labs Reviewed - No data to display ____________________________________________  EKG   ____________________________________________  RADIOLOGY  CT of the head and neck read by radiology and reviewed by me showing no signs of fracture no intracranial hematomas ____________________________________________   PROCEDURES    ____________________________________________   INITIAL IMPRESSION / ASSESSMENT AND PLAN / ED COURSE  Pertinent labs & imaging results that were available during my care of the patient were reviewed by me and considered in my medical decision making (see chart for details).   ____________________________________________   FINAL CLINICAL IMPRESSION(S) / ED DIAGNOSES  Final diagnoses:  Fall, initial encounter  Contusion      Arnaldo Natal, MD 12/25/15 2022

## 2015-12-25 NOTE — ED Notes (Signed)
Patient transported to CT 

## 2016-01-17 ENCOUNTER — Encounter
Admission: RE | Admit: 2016-01-17 | Discharge: 2016-01-17 | Disposition: A | Discharge status: Home / Self Care | Payer: Medicare PPO | Source: Ambulatory Visit | Attending: Internal Medicine | Admitting: Internal Medicine

## 2016-02-17 ENCOUNTER — Encounter
Admission: RE | Admit: 2016-02-17 | Discharge: 2016-02-17 | Disposition: A | Discharge status: Home / Self Care | Payer: Medicare PPO | Source: Ambulatory Visit | Attending: Internal Medicine | Admitting: Internal Medicine

## 2016-03-01 ENCOUNTER — Emergency Department: Payer: Medicare Other

## 2016-03-01 ENCOUNTER — Emergency Department
Admission: EM | Admit: 2016-03-01 | Discharge: 2016-03-01 | Disposition: A | Discharge status: Home / Self Care | Payer: Medicare Other | Attending: Emergency Medicine | Admitting: Emergency Medicine

## 2016-03-01 ENCOUNTER — Encounter: Payer: Self-pay | Admitting: Emergency Medicine

## 2016-03-01 DIAGNOSIS — M25552 Pain in left hip: Secondary | ICD-10-CM | POA: Diagnosis present

## 2016-03-01 DIAGNOSIS — W1809XA Striking against other object with subsequent fall, initial encounter: Secondary | ICD-10-CM | POA: Diagnosis not present

## 2016-03-01 DIAGNOSIS — W19XXXA Unspecified fall, initial encounter: Secondary | ICD-10-CM

## 2016-03-01 DIAGNOSIS — Y929 Unspecified place or not applicable: Secondary | ICD-10-CM | POA: Insufficient documentation

## 2016-03-01 DIAGNOSIS — T1490 Injury, unspecified: Secondary | ICD-10-CM | POA: Diagnosis not present

## 2016-03-01 DIAGNOSIS — Z87891 Personal history of nicotine dependence: Secondary | ICD-10-CM | POA: Diagnosis not present

## 2016-03-01 DIAGNOSIS — Z8659 Personal history of other mental and behavioral disorders: Secondary | ICD-10-CM | POA: Insufficient documentation

## 2016-03-01 DIAGNOSIS — Z79899 Other long term (current) drug therapy: Secondary | ICD-10-CM | POA: Insufficient documentation

## 2016-03-01 DIAGNOSIS — I10 Essential (primary) hypertension: Secondary | ICD-10-CM | POA: Insufficient documentation

## 2016-03-01 DIAGNOSIS — Y939 Activity, unspecified: Secondary | ICD-10-CM | POA: Insufficient documentation

## 2016-03-01 NOTE — ED Notes (Signed)
[facility] and was picked up by facility.  DC paperwork given as well as yellow DNR form.

## 2016-03-01 NOTE — ED Notes (Signed)
Spoke with Steward DroneBrenda at [facility].  Will call back if patient has transportation back to facility.

## 2016-03-01 NOTE — ED Notes (Signed)
Patient arrived via EMS from [facility] s/p fall out of her w/c.  EMS reports patient was trying to get out of her w/c and stood up falling on to a coffee table hitting the right side of her forehead-hematoma present. EMS states pt also c/o left hip pain.  Fall was witnessed and patient did not lose consciousness.  Patient is at baseline with her mentation per staff at [facility].  Patient has hx of dementia.

## 2016-03-01 NOTE — Discharge Instructions (Signed)
Fall Prevention in Hospitals, Adult As a hospital patient, your condition and the treatments you receive can increase your risk for falls. Some additional risk factors for falls in a hospital include:  Being in an unfamiliar environment.  Being on bed rest.  Your surgery.  Taking certain medicines.  Your tubing requirements, such as intravenous (IV) therapy or catheters. It is important that you learn how to decrease fall risks while at the hospital. Below are important tips that can help prevent falls. SAFETY TIPS FOR PREVENTING FALLS Talk about your risk of falling.  Ask your health care provider why you are at risk for falling. Is it your medicine, illness, tubing placement, or something else?  Make a plan with your health care provider to keep you safe from falls.  Ask your health care provider or pharmacist about side effects of your medicines. Some medicines can make you dizzy or affect your coordination. Ask for help.  Ask for help before getting out of bed. You may need to press your call button.  Ask for assistance in getting safely to the toilet.  Ask for a walker or cane to be put at your bedside. Ask that most of the side rails on your bed be placed up before your health care provider leaves the room.  Ask family or friends to sit with you.  Ask for things that are out of your reach, such as your glasses, hearing aids, telephone, bedside table, or call button. Follow these tips to avoid falling:  Stay lying or seated, rather than standing, while waiting for help.  Wear rubber-soled slippers or shoes whenever you walk in the hospital.  Avoid quick, sudden movements.  Change positions slowly.  Sit on the side of your bed before standing.  Stand up slowly and wait before you start to walk.  Let your health care provider know if there is a spill on the floor.  Pay careful attention to the medical equipment, electrical cords, and tubes around you.  When you  need help, use your call button by your bed or in the bathroom. Wait for one of your health care providers to help you.  If you feel dizzy or unsure of your footing, return to bed and wait for assistance.  Avoid being distracted by the TV, telephone, or another person in your room.  Do not lean or support yourself on rolling objects, such as IV poles or bedside tables.   This information is not intended to replace advice given to you by your health care provider. Make sure you discuss any questions you have with your health care provider.   Document Released: 11/01/2000 Document Revised: 11/25/2014 Document Reviewed: 07/12/2012 Elsevier Interactive Patient Education 2016 [employer]    Please note:  CT of the patient's hip showed a complex mass of the ovary. Please notify the patient's primary care doctor to decide on any further evaluation or management

## 2016-03-01 NOTE — ED Provider Notes (Signed)
[facility] Emergency Department Provider Note  ____________________________________________  Time seen: Approximately 11:49 AM  I have reviewed the triage vital signs and the nursing notes.   HISTORY  Chief Complaint Fall  History limited by severe dementia.  HPI Jamie Perez is a 82 y.o. female patient from nursing home where she apparently stood up from her wheelchair and fell and hit her head on the table. Does not appear to have lost consciousness. She fell several days ago in the same area of her head also. Patient has severe dementia and is reported be at her baseline. Patient also complains of pain in the left hip.No further history is available.   Past Medical History  Diagnosis Date  . Dementia   . Anxiety   . Bradycardia   . Hypertension   . Osteoporosis   . Insomnia   . Dysrhythmia, cardiac     Patient Active Problem List   Diagnosis Date Noted  . Bradycardia 12/24/2011  . Insomnia 10/30/2011  . Dementia 10/30/2011    History reviewed. No pertinent past surgical history.  Current Outpatient Rx  Name  Route  Sig  Dispense  Refill  . Cholecalciferol (VITAMIN D) 2000 UNITS CAPS   Oral   Take by mouth daily.           . clonazePAM (KLONOPIN) 0.5 MG tablet   Oral   Take 0.25 mg by mouth 2 (two) times daily as needed.           Marland Kitchen. DOCUSATE SODIUM PO   Oral   Take by mouth daily.           Marland Kitchen. donepezil (ARICEPT) 10 MG tablet   Oral   Take 1 tablet (10 mg total) by mouth daily.   30 tablet   6   . eszopiclone (LUNESTA) 1 MG TABS   Oral   Take 1 mg by mouth at bedtime as needed. Take immediately before bedtime         . Ferrous Gluconate (IRON) 240 (27 FE) MG TABS   Oral   Take by mouth daily.           . fluticasone (FLONASE) 50 MCG/ACT nasal spray   Nasal   Place 2 sprays into the nose.           Marland Kitchen. EXPIRED: lamoTRIgine (LAMICTAL) 25 MG tablet   Oral   Take 50 mg by mouth daily.         Marland Kitchen. EXPIRED:  lisinopril (PRINIVIL,ZESTRIL) 10 MG tablet   Oral   Take 1 tablet (10 mg total) by mouth daily.   90 tablet   3   . EXPIRED: mirtazapine (REMERON) 7.5 MG tablet   Oral   Take 1 tablet (7.5 mg total) by mouth at bedtime.   30 tablet   3   . Multiple Vitamin (MULTIVITAMIN) tablet   Oral   Take 1 tablet by mouth daily.           . psyllium (METAMUCIL) 58.6 % powder   Oral   Take 1 packet by mouth. Mixed with 6-8 fluid ounces daily          . sertraline (ZOLOFT) 50 MG tablet   Oral   Take 1.5 tablets (75 mg total) by mouth daily.   45 tablet   3   . traMADol (ULTRAM) 50 MG tablet   Oral   Take 50 mg by mouth 2 (two) times daily as needed.           .Marland Kitchen  vitamin C (ASCORBIC ACID) 500 MG tablet   Oral   Take 500 mg by mouth daily.           . vitamin E 400 UNIT capsule   Oral   Take 400 Units by mouth daily.             Allergies Aspirin; Cefuroxime axetil; Darvon; Penicillins; Procaine hcl; and Risedronate sodium  History reviewed. No pertinent family history.  Social History Social History  Substance Use Topics  . Smoking status: Former Games developer  . Smokeless tobacco: None  . Alcohol Use: No    Review of Systems Unable to obtain  ____________________________________________   PHYSICAL EXAM:  VITAL SIGNS: ED Triage Vitals  Enc Vitals Group     BP 03/01/16 1033 139/95 mmHg     Pulse Rate 03/01/16 1033 81     Resp 03/01/16 1033 14     Temp --      Temp src --      SpO2 03/01/16 1033 97 %     Weight 03/01/16 1033 122 lb 5.7 oz (55.5 kg)     Height 03/01/16 1033  (1.6 m)     Head Cir --      Peak Flow --      Pain Score --      Pain Loc --      Pain Edu? --      Excl. in GC? --     Constitutional: Alert And responds by looking at you and occasionally will not or shaking head. Eyes: Conjunctivae are normal. PERRL. EOMI. Head: Atraumatic. Except for a swollen area on the right temple. Nose: No congestion/rhinnorhea. Mouth/Throat:  Mucous membranes are moist.  Oropharynx non-erythematous. Neck: No stridor. Cardiovascular: Normal rate, regular rhythm. Grossly normal heart sounds.  Good peripheral circulation. Respiratory: Normal respiratory effort.  No retractions. Lungs CTAB. Gastrointestinal: Soft and nontender. No distention. No abdominal bruits. No CVA tenderness. Musculoskeletal: Right hip pain otherwise no acute  No joint effusions. Neurologic:   No new gross focal neurologic deficits are appreciated. .   ____________________________________________   LABS (all labs ordered are listed, but only abnormal results are displayed)  Labs Reviewed - No data to display ____________________________________________  EKG   ____________________________________________  RADIOLOGY  CT of the head shows no acute disease CT of the neck shows no obvious fractures CT of the hip shows no fractures there is a complex mass of the ovary however. This is all per radiology ____________________________________________   PROCEDURES    ____________________________________________   INITIAL IMPRESSION / ASSESSMENT AND PLAN / ED COURSE  Pertinent labs & imaging results that were available during my care of the patient were reviewed by me and considered in my medical decision making (see chart for details).   ____________________________________________   FINAL CLINICAL IMPRESSION(S) / ED DIAGNOSES  Final diagnoses:  Fall, initial encounter      Arnaldo Natal, MD 03/01/16 1459

## 2016-03-01 NOTE — ED Notes (Signed)
Patient transported to CT 

## 2016-03-18 ENCOUNTER — Encounter
Admission: RE | Admit: 2016-03-18 | Discharge: 2016-03-18 | Disposition: A | Discharge status: Home / Self Care | Payer: Medicare PPO | Source: Ambulatory Visit | Attending: Internal Medicine | Admitting: Internal Medicine

## 2016-04-18 ENCOUNTER — Encounter
Admission: RE | Admit: 2016-04-18 | Discharge: 2016-04-18 | Disposition: A | Discharge status: Home / Self Care | Payer: Medicare PPO | Source: Ambulatory Visit | Attending: Internal Medicine | Admitting: Internal Medicine

## 2016-05-18 ENCOUNTER — Encounter
Admission: RE | Admit: 2016-05-18 | Discharge: 2016-05-18 | Disposition: A | Discharge status: Home / Self Care | Payer: Medicare PPO | Source: Ambulatory Visit | Attending: Internal Medicine | Admitting: Internal Medicine

## 2016-06-18 ENCOUNTER — Encounter
Admission: RE | Admit: 2016-06-18 | Discharge: 2016-06-18 | Disposition: A | Discharge status: Home / Self Care | Payer: Medicare PPO | Source: Ambulatory Visit | Attending: Internal Medicine | Admitting: Internal Medicine

## 2016-07-19 ENCOUNTER — Encounter
Admission: RE | Admit: 2016-07-19 | Discharge: 2016-07-19 | Disposition: A | Discharge status: Home / Self Care | Payer: Medicare PPO | Source: Ambulatory Visit | Attending: Internal Medicine | Admitting: Internal Medicine

## 2016-08-18 ENCOUNTER — Encounter
Admission: RE | Admit: 2016-08-18 | Discharge: 2016-08-18 | Disposition: A | Discharge status: Home / Self Care | Payer: Medicare PPO | Source: Ambulatory Visit | Attending: Internal Medicine | Admitting: Internal Medicine

## 2016-09-03 DIAGNOSIS — Z515 Encounter for palliative care: Secondary | ICD-10-CM | POA: Diagnosis not present

## 2016-09-03 DIAGNOSIS — G309 Alzheimer's disease, unspecified: Secondary | ICD-10-CM | POA: Diagnosis not present

## 2016-09-03 DIAGNOSIS — R634 Abnormal weight loss: Secondary | ICD-10-CM | POA: Diagnosis not present

## 2016-09-03 DIAGNOSIS — Z66 Do not resuscitate: Secondary | ICD-10-CM | POA: Diagnosis not present

## 2016-09-17 ENCOUNTER — Other Ambulatory Visit
Admission: RE | Admit: 2016-09-17 | Discharge: 2016-09-17 | Disposition: A | Discharge status: Home / Self Care | Payer: Self-pay | Source: Ambulatory Visit | Attending: Internal Medicine | Admitting: Internal Medicine

## 2016-09-17 DIAGNOSIS — I10 Essential (primary) hypertension: Secondary | ICD-10-CM

## 2016-09-17 LAB — CBC WITH DIFFERENTIAL/PLATELET
Basophils Absolute: 0.1 10*3/uL (ref 0–0.1)
Basophils Relative: 1 %
Eosinophils Absolute: 0.4 10*3/uL (ref 0–0.7)
Eosinophils Relative: 7 %
HEMATOCRIT: 44.7 % (ref 35.0–47.0)
HEMOGLOBIN: 14.7 g/dL (ref 12.0–16.0)
Lymphocytes Relative: 28 %
Lymphs Abs: 1.5 10*3/uL (ref 1.0–3.6)
MCH: 29.7 pg (ref 26.0–34.0)
MCHC: 32.8 g/dL (ref 32.0–36.0)
MCV: 90.4 fL (ref 80.0–100.0)
MONO ABS: 0.4 10*3/uL (ref 0.2–0.9)
MONOS PCT: 7 %
NEUTROS ABS: 3.2 10*3/uL (ref 1.4–6.5)
NEUTROS PCT: 57 %
Platelets: 143 10*3/uL — ABNORMAL LOW (ref 150–440)
RBC: 4.95 MIL/uL (ref 3.80–5.20)
RDW: 14.6 % — ABNORMAL HIGH (ref 11.5–14.5)
WBC: 5.5 10*3/uL (ref 3.6–11.0)

## 2016-09-17 LAB — COMPREHENSIVE METABOLIC PANEL
ALK PHOS: 66 U/L (ref 47–119)
ALT: 14 U/L (ref 14–54)
ANION GAP: 7 (ref 5–15)
AST: 18 U/L (ref 15–41)
Albumin: 4 g/dL (ref 3.5–5.0)
BUN: 32 mg/dL — AB (ref 6–20)
CALCIUM: 9 mg/dL (ref 8.9–10.3)
CO2: 29 mmol/L (ref 22–32)
Chloride: 103 mmol/L (ref 101–111)
Creatinine, Ser: 1.14 mg/dL — ABNORMAL HIGH (ref 0.50–1.00)
Glucose, Bld: 125 mg/dL — ABNORMAL HIGH (ref 65–99)
POTASSIUM: 3.8 mmol/L (ref 3.5–5.1)
Sodium: 139 mmol/L (ref 135–145)
Total Bilirubin: <0.1 mg/dL — ABNORMAL LOW (ref 0.3–1.2)
Total Protein: 6.7 g/dL (ref 6.5–8.1)

## 2016-09-18 ENCOUNTER — Encounter
Admission: RE | Admit: 2016-09-18 | Discharge: 2016-09-18 | Disposition: A | Discharge status: Home / Self Care | Payer: Medicare PPO | Source: Ambulatory Visit | Attending: Internal Medicine | Admitting: Internal Medicine

## 2016-09-18 DIAGNOSIS — I10 Essential (primary) hypertension: Secondary | ICD-10-CM | POA: Insufficient documentation

## 2016-09-24 DIAGNOSIS — I10 Essential (primary) hypertension: Secondary | ICD-10-CM | POA: Diagnosis not present

## 2016-09-24 LAB — CBC WITH DIFFERENTIAL/PLATELET
BASOS ABS: 0.1 10*3/uL (ref 0–0.1)
Basophils Relative: 1 %
Eosinophils Absolute: 0.4 10*3/uL (ref 0–0.7)
Eosinophils Relative: 7 %
HEMATOCRIT: 43.6 % (ref 35.0–47.0)
Hemoglobin: 14.9 g/dL (ref 12.0–16.0)
LYMPHS PCT: 30 %
Lymphs Abs: 1.9 10*3/uL (ref 1.0–3.6)
MCH: 30 pg (ref 26.0–34.0)
MCHC: 34.2 g/dL (ref 32.0–36.0)
MCV: 87.8 fL (ref 80.0–100.0)
MONOS PCT: 8 %
Monocytes Absolute: 0.5 10*3/uL (ref 0.2–0.9)
Neutro Abs: 3.4 10*3/uL (ref 1.4–6.5)
Neutrophils Relative %: 54 %
Platelets: 151 10*3/uL (ref 150–440)
RBC: 4.96 MIL/uL (ref 3.80–5.20)
RDW: 14.8 % — ABNORMAL HIGH (ref 11.5–14.5)
WBC: 6.2 10*3/uL (ref 3.6–11.0)

## 2016-09-24 LAB — COMPREHENSIVE METABOLIC PANEL
ALK PHOS: 69 U/L (ref 38–126)
ALT: 14 U/L (ref 14–54)
AST: 17 U/L (ref 15–41)
Albumin: 3.9 g/dL (ref 3.5–5.0)
Anion gap: 8 (ref 5–15)
BILIRUBIN TOTAL: 0.4 mg/dL (ref 0.3–1.2)
BUN: 33 mg/dL — AB (ref 6–20)
CALCIUM: 9.1 mg/dL (ref 8.9–10.3)
CO2: 26 mmol/L (ref 22–32)
Chloride: 105 mmol/L (ref 101–111)
Creatinine, Ser: 0.82 mg/dL (ref 0.44–1.00)
GFR calc Af Amer: >60 mL/min (ref >60)
GFR calc non Af Amer: >60 mL/min (ref >60)
GLUCOSE: 90 mg/dL (ref 65–99)
Potassium: 4.1 mmol/L (ref 3.5–5.1)
Sodium: 139 mmol/L (ref 135–145)
TOTAL PROTEIN: 6.7 g/dL (ref 6.5–8.1)

## 2016-10-18 ENCOUNTER — Encounter
Admission: RE | Admit: 2016-10-18 | Discharge: 2016-10-18 | Disposition: A | Discharge status: Home / Self Care | Payer: Medicare PPO | Source: Ambulatory Visit | Attending: Internal Medicine | Admitting: Internal Medicine

## 2016-10-23 ENCOUNTER — Non-Acute Institutional Stay: Payer: Medicare Other | Admitting: Gerontology

## 2016-10-23 DIAGNOSIS — J181 Lobar pneumonia, unspecified organism: Principal | ICD-10-CM

## 2016-10-23 DIAGNOSIS — J189 Pneumonia, unspecified organism: Secondary | ICD-10-CM | POA: Diagnosis not present

## 2016-11-01 ENCOUNTER — Non-Acute Institutional Stay: Payer: Medicare Other | Admitting: Gerontology

## 2016-11-01 DIAGNOSIS — J189 Pneumonia, unspecified organism: Secondary | ICD-10-CM

## 2016-11-01 DIAGNOSIS — J181 Lobar pneumonia, unspecified organism: Principal | ICD-10-CM

## 2016-11-04 NOTE — Progress Notes (Addendum)
Location:      Place of Service:  ALF (13) Provider:  Lorenso Quarry, NP-C  Wynona Dove, MD  Patient Care Team: Shelia Media, MD as PCP - General (Internal Medicine)  Extended Emergency Contact Information Primary Emergency Contact: [facility] Address: [address]          [place], [place] [address] [place] of [place] Home Phone: [phone] Relation: Other Secondary Emergency Contact: Tremper,ANN Address: [address] [id number] Home Phone: [phone] Relation: None  Code Status:  DNR Goals of care: Advanced Directive information Advanced Directives 03/01/2016  Does Patient Have a Medical Advance Directive? Yes  Type of Advance Directive Out of facility DNR (pink MOST or yellow form)  Does patient want to make changes to medical advance directive? No - Patient declined  Copy of Healthcare Power of Attorney in Chart? No - copy requested  Pre-existing out of facility DNR order (yellow form or pink MOST form) -     Chief Complaint  Patient presents with  . Acute Visit    HPI:  Pt is a 82 y.o. female seen today for an acute visit for increased Congestion and fever. Patient has been in her usual state of health until earlier in the week, patient began running a low-grade fever and began coughing. The cough was congested sounding, the patient is unable to expectorate secretions. Patient has been having increased fatigue and has not quite been herself for a few days. Please note, patient is confused and has dementia. Therefore firsthand ROS unable to be obtained.   Past Medical History:  Diagnosis Date  . Anxiety   . Bradycardia   . Dementia   . Dysrhythmia, cardiac   . Hypertension   . Insomnia   . Osteoporosis    No past surgical history on file.  Allergies  Allergen Reactions  . Aspirin   . Cefuroxime Axetil   . Darvon   . Penicillins   . Procaine Hcl   . Risedronate Sodium     Allergies as of 10/23/2016      Reactions   Aspirin    Cefuroxime Axetil    Darvon    Penicillins    Procaine Hcl    Risedronate Sodium       Medication List       Accurate as of 10/23/16 11:59 PM. Always use your most recent med list.          clonazePAM 0.5 MG tablet Commonly known as:  KLONOPIN Take 0.25 mg by mouth 2 (two) times daily as needed.   DOCUSATE SODIUM PO Take by mouth daily.   donepezil 10 MG tablet Commonly known as:  ARICEPT Take 1 tablet (10 mg total) by mouth daily.   eszopiclone 1 MG Tabs tablet Commonly known as:  LUNESTA Take 1 mg by mouth at bedtime as needed. Take immediately before bedtime   FLONASE 50 MCG/ACT nasal spray Generic drug:  fluticasone Place 2 sprays into the nose.   Iron 240 (27 Fe) MG Tabs Take by mouth daily.   multivitamin tablet Take 1 tablet by mouth daily.   psyllium 58.6 % powder Commonly known as:  METAMUCIL Take 1 packet by mouth. Mixed with 6-8 fluid ounces daily   sertraline 50 MG tablet Commonly known as:  ZOLOFT Take 1.5 tablets (75 mg total) by mouth daily.   traMADol 50 MG tablet Commonly known as:  ULTRAM Take 50 mg by mouth 2 (two) times daily as  needed.   vitamin C 500 MG tablet Commonly known as:  ASCORBIC ACID Take 500 mg by mouth daily.   Vitamin D 2000 units Caps Take by mouth daily.   vitamin E 400 UNIT capsule Take 400 Units by mouth daily.       Review of Systems  Unable to perform ROS: Dementia  Constitutional: Positive for activity change and fever. Negative for appetite change, chills and diaphoresis.  HENT: Positive for congestion. Negative for sneezing, sore throat, trouble swallowing and voice change.   Eyes: Negative.   Respiratory: Positive for cough. Negative for shortness of breath.   Cardiovascular: Negative for chest pain.  Gastrointestinal: Negative.   Genitourinary: Negative.  Negative for difficulty urinating, dysuria, frequency and urgency.  Musculoskeletal: Negative.  Negative for back pain, gait problem  and myalgias. Arthralgias: typical arthritis.  Skin: Negative for color change, pallor, rash and wound.  Neurological: Negative for dizziness, tremors, syncope, speech difficulty, weakness, numbness and headaches.  Hematological: Negative.   Psychiatric/Behavioral: Negative for agitation and behavioral problems.  All other systems reviewed and are negative.    There is no immunization history on file for this patient. Pertinent  Health Maintenance Due  Topic Date Due  . DEXA SCAN  02/02/1992  . PNA vac Low Risk Adult (1 of 2 - PCV13) 02/02/1992  . INFLUENZA VACCINE  06/18/2016   No flowsheet data found. Functional Status Survey:    Vitals:   10/23/16 0630  Temp: 100.3 F (37.9 C)  Weight: 130 lb 6.4 oz (59.1 kg)   Body mass index is 23.1 kg/m. Physical Exam  Constitutional: She is oriented to person, place, and time. Vital signs are normal. She appears well-developed and well-nourished. She appears lethargic. She is active and cooperative. She is easily aroused. She appears ill. No distress.  HENT:  Head: Normocephalic and atraumatic.  Mouth/Throat: Uvula is midline, oropharynx is clear and moist and mucous membranes are normal. Mucous membranes are not pale, not dry and not cyanotic.  Eyes: Conjunctivae, EOM and lids are normal. Pupils are equal, round, and reactive to light.  Neck: Trachea normal, normal range of motion and full passive range of motion without pain. Neck supple. No JVD present. No tracheal deviation, no edema and no erythema present. No thyromegaly present.  Cardiovascular: Normal rate, regular rhythm, normal heart sounds, intact distal pulses and normal pulses.  Exam reveals no gallop, no distant heart sounds and no friction rub.   No murmur heard. Pulmonary/Chest: Effort normal. No accessory muscle usage. No respiratory distress. She has decreased breath sounds in the right upper field and the left upper field. She has no wheezes. She has rhonchi in the right  middle field, the right lower field, the left middle field and the left lower field. She has rales in the right middle field, the right lower field, the left middle field and the left lower field. She exhibits no tenderness.  Abdominal: Soft. Normal appearance and bowel sounds are normal. She exhibits no distension and no ascites. There is no tenderness.  Musculoskeletal: Normal range of motion. She exhibits no edema or tenderness.  Expected osteoarthritis, stiffness  Neurological: She is oriented to person, place, and time and easily aroused. She has normal strength. She appears lethargic.  Skin: Skin is warm, dry and intact. She is not diaphoretic. No cyanosis. No pallor. Nails show no clubbing.  Psychiatric: She has a normal mood and affect. Her speech is normal and behavior is normal. Judgment and thought content normal. Cognition  and memory are normal.  Nursing note and vitals reviewed.   Labs reviewed:  Recent Labs  09/17/16 1600 09/24/16 1545  NA 139 139  K 3.8 4.1  CL 103 105  CO2 29 26  GLUCOSE 125* 90  BUN 32* 33*  CREATININE 1.14* 0.82  CALCIUM 9.0 9.1    Recent Labs  09/17/16 1600 09/24/16 1545  AST 18 17  ALT 14 14  ALKPHOS 66 69  BILITOT <0.1* 0.4  PROT 6.7 6.7  ALBUMIN 4.0 3.9    Recent Labs  09/17/16 1600 09/24/16 1545  WBC 5.5 6.2  NEUTROABS 3.2 3.4  HGB 14.7 14.9  HCT 44.7 43.6  MCV 90.4 87.8  PLT 143* 151   Lab Results  Component Value Date   TSH 0.751 12/07/2011   Lab Results  Component Value Date   HGBA1C 6.0 12/09/2011   No results found for: CHOL, HDL, LDLCALC, LDLDIRECT, TRIG, CHOLHDL  Significant Diagnostic Results in last 30 days:  No results found.  Assessment/Plan  1. Pneumonia of both lower lobes due to infectious organism  Levaquin 750 mg 1 tablet PO every other day (renally adjusted) for 4 doses  Duonebs scheduled TID x 3 days then prn  Guaifenesin 10 mL po Q 4 hours prn cough, congestion  Increase po fluid  intake  Tylenol for pain, comfort, fever  Family/ staff Communication:   Total Time:  Documentation:  Face to Face:  Family/Phone: Palliative Care NP updated   Labs/tests ordered:  2- view CXR    Medication list reviewed and assessed for continued appropriateness.  Brynda RimShannon H. Reo Portela, NP-C Geriatrics [facility] [phone] [place] , [place] [address] Cell Phone (Mon-Fri 8am-5pm):  [phone] On Call:  [phone] & follow prompts after 5pm & weekends Office Phone:  [phone] Office Fax:  [phone]

## 2016-11-04 NOTE — Progress Notes (Signed)
Location:      Place of Service:  ALF (13) Provider:  Lorenso QuarryShannon Greta Yung, NP-C  Jamie DoveWALKER,Jamie AZBELL, MD  Patient Care Team: Shelia MediaJennifer A Walker, MD as PCP - General (Internal Medicine)  Extended Emergency Contact Information Primary Emergency Contact: [facility] Address: [address]          [place], [place] [id number] [place] Home Phone: [phone] Relation: Other Secondary Emergency Contact: Dobis,ANN Address: [address]          [place] [id number] Home Phone: [phone] Relation: None  Code Status:  DNR Goals of care: Advanced Directive information Advanced Directives 03/01/2016  Does Patient Have a Medical Advance Directive? Yes  Type of Advance Directive Out of facility DNR (pink MOST or yellow form)  Does patient want to make changes to medical advance directive? No - Patient declined  Copy of Healthcare Power of Attorney in Chart? No - copy requested  Pre-existing out of facility DNR order (yellow form or pink MOST form) -     Chief Complaint  Patient presents with  . Follow-up    HPI:  Pt is a 82 y.o. female seen today for a follow up visit for increased Congestion and fever. Patient has been in her usual state of health until earlier in the week, patient began running a low-grade fever and began coughing. The cough was congested sounding, the patient is unable to expectorate secretions. Patient has been having increased fatigue and has not quite been herself for a few days. Please note, patient is confused and has dementia. Therefore firsthand ROS unable to be obtained. Pt was given full coarse of po antibiotics and scheduled duonebs. Staff report she is back at baseline.    Past Medical History:  Diagnosis Date  . Anxiety   . Bradycardia   . Dementia   . Dysrhythmia, cardiac   . Hypertension   . Insomnia   . Osteoporosis    No past surgical history on file.  Allergies  Allergen Reactions  . Aspirin   . Cefuroxime Axetil   . Darvon   .  Penicillins   . Procaine Hcl   . Risedronate Sodium     Allergies as of 11/01/2016      Reactions   Aspirin    Cefuroxime Axetil    Darvon    Penicillins    Procaine Hcl    Risedronate Sodium       Medication List       Accurate as of 11/01/16 11:59 PM. Always use your most recent med list.          clonazePAM 0.5 MG tablet Commonly known as:  KLONOPIN Take 0.25 mg by mouth 2 (two) times daily as needed.   DOCUSATE SODIUM PO Take by mouth daily.   donepezil 10 MG tablet Commonly known as:  ARICEPT Take 1 tablet (10 mg total) by mouth daily.   eszopiclone 1 MG Tabs tablet Commonly known as:  LUNESTA Take 1 mg by mouth at bedtime as needed. Take immediately before bedtime   FLONASE 50 MCG/ACT nasal spray Generic drug:  fluticasone Place 2 sprays into the nose.   Iron 240 (27 Fe) MG Tabs Take by mouth daily.   multivitamin tablet Take 1 tablet by mouth daily.   psyllium 58.6 % powder Commonly known as:  METAMUCIL Take 1 packet by mouth. Mixed with 6-8 fluid ounces daily   sertraline 50 MG tablet Commonly known as:  ZOLOFT Take 1.5 tablets (75 mg total) by mouth daily.  traMADol 50 MG tablet Commonly known as:  ULTRAM Take 50 mg by mouth 2 (two) times daily as needed.   vitamin C 500 MG tablet Commonly known as:  ASCORBIC ACID Take 500 mg by mouth daily.   Vitamin D 2000 units Caps Take by mouth daily.   vitamin E 400 UNIT capsule Take 400 Units by mouth daily.       Review of Systems  Unable to perform ROS: Dementia  Constitutional: Negative for activity change, appetite change, chills, diaphoresis and fever.  HENT: Positive for congestion (clears with cough). Negative for sneezing, sore throat, trouble swallowing and voice change.   Eyes: Negative.   Respiratory: Positive for cough (slight). Negative for shortness of breath.   Cardiovascular: Negative for chest pain.  Gastrointestinal: Negative.   Genitourinary: Negative.  Negative for  difficulty urinating, dysuria, frequency and urgency.  Musculoskeletal: Negative.  Negative for back pain, gait problem and myalgias. Arthralgias: typical arthritis.  Skin: Negative for color change, pallor, rash and wound.  Neurological: Negative for dizziness, tremors, syncope, speech difficulty, weakness, numbness and headaches.  Hematological: Negative.   Psychiatric/Behavioral: Negative for agitation and behavioral problems.  All other systems reviewed and are negative.    There is no immunization history on file for this patient. Pertinent  Health Maintenance Due  Topic Date Due  . DEXA SCAN  02/02/1992  . PNA vac Low Risk Adult (1 of 2 - PCV13) 02/02/1992  . INFLUENZA VACCINE  06/18/2016   No flowsheet data found. Functional Status Survey:    Vitals:   10/30/16 0800  BP: 128/68  Pulse: 66  Resp: 18  Temp: 97 F (36.1 C)  SpO2: 98%  Weight: 129 lb 12.8 oz (58.9 kg)   Body mass index is 22.99 kg/m. Physical Exam  Constitutional: She is oriented to person, place, and time. Vital signs are normal. She appears well-developed and well-nourished. She is active and cooperative. She does not appear ill. No distress.  HENT:  Head: Normocephalic and atraumatic.  Mouth/Throat: Uvula is midline, oropharynx is clear and moist and mucous membranes are normal. Mucous membranes are not pale, not dry and not cyanotic.  Eyes: Conjunctivae, EOM and lids are normal. Pupils are equal, round, and reactive to light.  Neck: Trachea normal, normal range of motion and full passive range of motion without pain. Neck supple. No JVD present. No tracheal deviation, no edema and no erythema present. No thyromegaly present.  Cardiovascular: Normal rate, regular rhythm, normal heart sounds, intact distal pulses and normal pulses.  Exam reveals no gallop, no distant heart sounds and no friction rub.   No murmur heard. Pulmonary/Chest: Effort normal. No accessory muscle usage. No respiratory distress.  She has decreased breath sounds in the right upper field and the left upper field. She has no wheezes. She has rhonchi (slight- clears with cough) in the right middle field, the right lower field, the left middle field and the left lower field. She has no rales. She exhibits no tenderness.  Abdominal: Soft. Normal appearance and bowel sounds are normal. She exhibits no distension and no ascites. There is no tenderness.  Musculoskeletal: Normal range of motion. She exhibits no edema or tenderness.  Expected osteoarthritis, stiffness  Neurological: She is alert and oriented to person, place, and time. She has normal strength.  Skin: Skin is warm, dry and intact. She is not diaphoretic. No cyanosis. No pallor. Nails show no clubbing.  Psychiatric: She has a normal mood and affect. Her speech is normal  and behavior is normal. Judgment and thought content normal. Cognition and memory are normal.  Nursing note and vitals reviewed.   Labs reviewed:  Recent Labs  09/17/16 1600 09/24/16 1545  NA 139 139  K 3.8 4.1  CL 103 105  CO2 29 26  GLUCOSE 125* 90  BUN 32* 33*  CREATININE 1.14* 0.82  CALCIUM 9.0 9.1    Recent Labs  09/17/16 1600 09/24/16 1545  AST 18 17  ALT 14 14  ALKPHOS 66 69  BILITOT <0.1* 0.4  PROT 6.7 6.7  ALBUMIN 4.0 3.9    Recent Labs  09/17/16 1600 09/24/16 1545  WBC 5.5 6.2  NEUTROABS 3.2 3.4  HGB 14.7 14.9  HCT 44.7 43.6  MCV 90.4 87.8  PLT 143* 151   Lab Results  Component Value Date   TSH 0.751 12/07/2011   Lab Results  Component Value Date   HGBA1C 6.0 12/09/2011   No results found for: CHOL, HDL, LDLCALC, LDLDIRECT, TRIG, CHOLHDL  Significant Diagnostic Results in last 30 days:  No results found.  Assessment/Plan  1. Pneumonia of both lower lobes due to infectious organism  Resolved  Levaquin 750 mg 1 tablet PO every other day (renally adjusted) for 4 doses- completed  Duonebs scheduled TID x 3 days then prn- completed  Guaifenesin  10 mL po Q 4 hours prn cough, congestion- completed  Increase po fluid intake  Tylenol for pain, comfort, fever  Family/ staff Communication:   Total Time:  Documentation:  Face to Face:  Family/Phone: Palliative Care NP updated   Labs/tests ordered:      Medication list reviewed and assessed for continued appropriateness.  Brynda Rim, NP-C [facility]iatrics [facility] [phone] N. [address], [place] [address] Cell Phone (Mon-Fri 8am-5pm):  [phone] On Call:  [phone] & follow prompts after 5pm & weekends Office Phone:  [phone] Office Fax:  [phone]

## 2016-11-18 ENCOUNTER — Encounter
Admission: RE | Admit: 2016-11-18 | Discharge: 2016-11-18 | Disposition: A | Discharge status: Home / Self Care | Payer: Medicare Other | Source: Ambulatory Visit | Attending: Internal Medicine | Admitting: Internal Medicine

## 2016-11-18 DIAGNOSIS — R05 Cough: Secondary | ICD-10-CM | POA: Insufficient documentation

## 2016-12-10 DIAGNOSIS — R05 Cough: Secondary | ICD-10-CM | POA: Diagnosis present

## 2016-12-10 LAB — INFLUENZA PANEL BY PCR (TYPE A & B)
Influenza A By PCR: POSITIVE — AB
Influenza B By PCR: NEGATIVE

## 2016-12-11 ENCOUNTER — Non-Acute Institutional Stay: Payer: Medicare Other | Admitting: Gerontology

## 2016-12-11 DIAGNOSIS — J101 Influenza due to other identified influenza virus with other respiratory manifestations: Secondary | ICD-10-CM

## 2016-12-19 ENCOUNTER — Encounter
Admission: RE | Admit: 2016-12-19 | Discharge: 2016-12-19 | Disposition: A | Discharge status: Home / Self Care | Payer: Medicare Other | Source: Ambulatory Visit | Attending: Internal Medicine | Admitting: Internal Medicine

## 2016-12-27 NOTE — Addendum Note (Signed)
Addended by: Lorenso QuarryLEACH, Lamar Naef H on: 12/27/2016 10:30 PM   Modules accepted: Level of Service

## 2016-12-27 NOTE — Progress Notes (Signed)
Location:      Place of Service:  ALF (13) Provider:  Lorenso Quarry, NP-C  Wynona Dove, MD  Patient Care Team: Shelia Media, MD as PCP - General (Internal Medicine)  Extended Emergency Contact Information Primary Emergency Contact: [facility] Address: [address]          [place], [place] [address] [place] Home Phone: [phone] Relation: Other Secondary Emergency Contact: Rascon,ANN Address: [address] [id number] Home Phone: [phone] Relation: None  Code Status:  dnr Goals of care: Advanced Directive information Advanced Directives 03/01/2016  Does Patient Have a Medical Advance Directive? Yes  Type of Advance Directive Out of facility DNR (pink MOST or yellow form)  Does patient want to make changes to medical advance directive? No - Patient declined  Copy of Healthcare Power of Attorney in Chart? No - copy requested  Pre-existing out of facility DNR order (yellow form or pink MOST form) -     Chief Complaint  Patient presents with  . Acute Visit    HPI:  Pt is a 83 y.o. female seen today for an acute visit for cough and congestion. Staff report pt began with the cough and congestion several days ago. Staff reports pt is not eating or drinking and has been febrile intermittently. Oxygen in place. Pt is ill appearing. Pt does have advanced dementia and has been showing a gradual slow decline. It is highly likely that she will not recover from the acute illness. After family discussions, daughters do not want treatment for any sequela, ie. PNA, etc. Family opts for comfort measures only. Will continue to monitor. No other complaints.     Past Medical History:  Diagnosis Date  . Anxiety   . Bradycardia   . Dementia   . Dysrhythmia, cardiac   . Hypertension   . Insomnia   . Osteoporosis    No past surgical history on file.  Allergies  Allergen Reactions  . Aspirin   . Cefuroxime Axetil   . Darvon   .  Penicillins   . Procaine Hcl   . Risedronate Sodium     Allergies as of 12/11/2016      Reactions   Aspirin    Cefuroxime Axetil    Darvon    Penicillins    Procaine Hcl    Risedronate Sodium       Medication List       Accurate as of 12/11/16 11:59 PM. Always use your most recent med list.          clonazePAM 0.5 MG tablet Commonly known as:  KLONOPIN Take 0.25 mg by mouth 2 (two) times daily as needed.   DOCUSATE SODIUM PO Take by mouth daily.   donepezil 10 MG tablet Commonly known as:  ARICEPT Take 1 tablet (10 mg total) by mouth daily.   eszopiclone 1 MG Tabs tablet Commonly known as:  LUNESTA Take 1 mg by mouth at bedtime as needed. Take immediately before bedtime   FLONASE 50 MCG/ACT nasal spray Generic drug:  fluticasone Place 2 sprays into the nose.   Iron 240 (27 Fe) MG Tabs Take by mouth daily.   multivitamin tablet Take 1 tablet by mouth daily.   psyllium 58.6 % powder Commonly known as:  METAMUCIL Take 1 packet by mouth. Mixed with 6-8 fluid ounces daily   sertraline 50 MG tablet Commonly known as:  ZOLOFT Take 1.5 tablets (75 mg total) by mouth daily.  traMADol 50 MG tablet Commonly known as:  ULTRAM Take 50 mg by mouth 2 (two) times daily as needed.   vitamin C 500 MG tablet Commonly known as:  ASCORBIC ACID Take 500 mg by mouth daily.   Vitamin D 2000 units Caps Take by mouth daily.   vitamin E 400 UNIT capsule Take 400 Units by mouth daily.       Review of Systems  Unable to perform ROS: Dementia  Constitutional: Positive for activity change, appetite change, chills, diaphoresis, fatigue and fever.  HENT: Positive for congestion (clears with cough) and trouble swallowing. Negative for sneezing, sore throat and voice change.   Eyes: Negative.   Respiratory: Positive for cough (slight) and shortness of breath.   Cardiovascular: Negative for chest pain.  Gastrointestinal: Negative.   Genitourinary: Negative.  Negative for  difficulty urinating, dysuria, frequency and urgency.  Musculoskeletal: Negative.  Negative for back pain, gait problem and myalgias. Arthralgias: typical arthritis.  Skin: Negative for color change, pallor, rash and wound.  Neurological: Negative for dizziness, tremors, syncope, speech difficulty, weakness, numbness and headaches.  Hematological: Negative.   Psychiatric/Behavioral: Negative for agitation and behavioral problems.  All other systems reviewed and are negative.    There is no immunization history on file for this patient. Pertinent  Health Maintenance Due  Topic Date Due  . DEXA SCAN  02/02/1992  . PNA vac Low Risk Adult (1 of 2 - PCV13) 02/02/1992  . INFLUENZA VACCINE  06/18/2016   No flowsheet data found. Functional Status Survey:    There were no vitals filed for this visit. There is no height or weight on file to calculate BMI. Physical Exam  Constitutional: She is oriented to person, place, and time. She appears well-developed and well-nourished. She appears listless. She is active and cooperative. She has a sickly appearance. She does not appear ill. No distress. Nasal cannula in place.  HENT:  Head: Normocephalic and atraumatic.  Mouth/Throat: Uvula is midline, oropharynx is clear and moist and mucous membranes are normal. Mucous membranes are not pale, not dry and not cyanotic.  Eyes: Conjunctivae, EOM and lids are normal. Pupils are equal, round, and reactive to light.  Neck: Trachea normal, normal range of motion and full passive range of motion without pain. Neck supple. No JVD present. No tracheal deviation, no edema and no erythema present. No thyromegaly present.  Cardiovascular: Normal rate, normal heart sounds, intact distal pulses and normal pulses.  An irregular rhythm present. Exam reveals no gallop, no distant heart sounds and no friction rub.   No murmur heard. Pulmonary/Chest: Effort normal. No accessory muscle usage. No respiratory distress. She  has decreased breath sounds in the right upper field and the left upper field. She has wheezes in the right middle field, the right lower field, the left middle field and the left lower field. She has rhonchi (slight- clears with cough) in the right middle field, the right lower field, the left middle field and the left lower field. She has no rales. She exhibits no tenderness.  Abdominal: Soft. Normal appearance and bowel sounds are normal. She exhibits no distension and no ascites. There is no tenderness.  Musculoskeletal: Normal range of motion. She exhibits no edema or tenderness.  Expected osteoarthritis, stiffness  Neurological: She is oriented to person, place, and time. She has normal strength. She appears listless.  Skin: Skin is warm, dry and intact. She is not diaphoretic. No cyanosis. No pallor. Nails show no clubbing.  Psychiatric: She has  a normal mood and affect. Her speech is normal and behavior is normal. Judgment and thought content normal. Cognition and memory are normal.  Nursing note and vitals reviewed.   Labs reviewed:  Recent Labs  09/17/16 1600 09/24/16 1545  NA 139 139  K 3.8 4.1  CL 103 105  CO2 29 26  GLUCOSE 125* 90  BUN 32* 33*  CREATININE 1.14* 0.82  CALCIUM 9.0 9.1    Recent Labs  09/17/16 1600 09/24/16 1545  AST 18 17  ALT 14 14  ALKPHOS 66 69  BILITOT <0.1* 0.4  PROT 6.7 6.7  ALBUMIN 4.0 3.9    Recent Labs  09/17/16 1600 09/24/16 1545  WBC 5.5 6.2  NEUTROABS 3.2 3.4  HGB 14.7 14.9  HCT 44.7 43.6  MCV 90.4 87.8  PLT 143* 151   Lab Results  Component Value Date   TSH 0.751 12/07/2011   Lab Results  Component Value Date   HGBA1C 6.0 12/09/2011   No results found for: CHOL, HDL, LDLCALC, LDLDIRECT, TRIG, CHOLHDL  Significant Diagnostic Results in last 30 days:  No results found.  Assessment/Plan 1. Influenza A  Tamiflu 75 mg po BID x 5 days  Encourage po fluid intake  Symptom management  Droplet precautions- pt is to  remain on precautions until 48 hours after last symptom  Updated Palliative Care NP  Hospice Referral for in-facility services  Family/ staff Communication:   Total Time:  Documentation:  Face to Face:  Family/Phone:   Labs/tests ordered:  Flu swab  Medication list reviewed and assessed for continued appropriateness.  Brynda Rim, NP-C Geriatrics [facility] [phone] [address]. [address], [place] [address] Cell Phone (Mon-Fri 8am-5pm):  [phone] On Call:  [phone] & follow prompts after 5pm & weekends Office Phone:  [phone] Office Fax:  [phone]

## 2017-01-16 ENCOUNTER — Encounter
Admission: RE | Admit: 2017-01-16 | Discharge: 2017-01-16 | Disposition: A | Discharge status: Home / Self Care | Payer: Medicare Other | Source: Ambulatory Visit | Attending: Internal Medicine | Admitting: Internal Medicine

## 2017-02-16 ENCOUNTER — Encounter
Admission: RE | Admit: 2017-02-16 | Discharge: 2017-02-16 | Disposition: A | Discharge status: Home / Self Care | Payer: Medicare Other | Source: Ambulatory Visit | Attending: Internal Medicine | Admitting: Internal Medicine

## 2017-02-19 ENCOUNTER — Non-Acute Institutional Stay (SKILLED_NURSING_FACILITY): Payer: Medicare Other | Admitting: Gerontology

## 2017-02-19 DIAGNOSIS — J309 Allergic rhinitis, unspecified: Secondary | ICD-10-CM

## 2017-02-19 DIAGNOSIS — F329 Major depressive disorder, single episode, unspecified: Secondary | ICD-10-CM | POA: Diagnosis not present

## 2017-02-19 DIAGNOSIS — R001 Bradycardia, unspecified: Secondary | ICD-10-CM | POA: Diagnosis not present

## 2017-02-19 DIAGNOSIS — F39 Unspecified mood [affective] disorder: Secondary | ICD-10-CM

## 2017-02-19 DIAGNOSIS — F0280 Dementia in other diseases classified elsewhere without behavioral disturbance: Secondary | ICD-10-CM

## 2017-02-19 DIAGNOSIS — G4700 Insomnia, unspecified: Secondary | ICD-10-CM | POA: Diagnosis not present

## 2017-02-19 DIAGNOSIS — I10 Essential (primary) hypertension: Secondary | ICD-10-CM

## 2017-02-19 DIAGNOSIS — M810 Age-related osteoporosis without current pathological fracture: Secondary | ICD-10-CM | POA: Diagnosis not present

## 2017-02-19 DIAGNOSIS — G301 Alzheimer's disease with late onset: Secondary | ICD-10-CM

## 2017-02-19 DIAGNOSIS — E639 Nutritional deficiency, unspecified: Secondary | ICD-10-CM | POA: Diagnosis not present

## 2017-02-19 DIAGNOSIS — F419 Anxiety disorder, unspecified: Secondary | ICD-10-CM

## 2017-02-27 DIAGNOSIS — M810 Age-related osteoporosis without current pathological fracture: Secondary | ICD-10-CM | POA: Insufficient documentation

## 2017-02-27 DIAGNOSIS — F0280 Dementia in other diseases classified elsewhere without behavioral disturbance: Secondary | ICD-10-CM | POA: Insufficient documentation

## 2017-02-27 DIAGNOSIS — F39 Unspecified mood [affective] disorder: Secondary | ICD-10-CM | POA: Insufficient documentation

## 2017-02-27 DIAGNOSIS — I10 Essential (primary) hypertension: Secondary | ICD-10-CM | POA: Insufficient documentation

## 2017-02-27 DIAGNOSIS — J309 Allergic rhinitis, unspecified: Secondary | ICD-10-CM | POA: Insufficient documentation

## 2017-02-27 DIAGNOSIS — E639 Nutritional deficiency, unspecified: Secondary | ICD-10-CM | POA: Insufficient documentation

## 2017-02-27 DIAGNOSIS — F419 Anxiety disorder, unspecified: Secondary | ICD-10-CM | POA: Insufficient documentation

## 2017-02-27 DIAGNOSIS — F329 Major depressive disorder, single episode, unspecified: Secondary | ICD-10-CM | POA: Insufficient documentation

## 2017-02-27 NOTE — Progress Notes (Signed)
Location:      Place of Service:  SNF (31) Provider:  Lorenso Quarry, NP-C  No primary care provider on file.  No care team member to display  Extended Emergency Contact Information Primary Emergency Contact: [facility] Address: [address]          [place], [place] [address] Darden [place] of [place] Home Phone: [phone] Relation: Other Secondary Emergency Contact: Cottrell,Ann Address: [address] [place], [place] [address] Darden [place] of [place] Home Phone: [phone] Relation: Daughter  Code Status:  DNR Goals of care: Advanced Directive information Advanced Directives 03/01/2016  Does Patient Have a Medical Advance Directive? Yes  Type of Advance Directive Out of facility DNR (pink MOST or yellow form)  Does patient want to make changes to medical advance directive? No - Patient declined  Copy of Healthcare Power of Attorney in Chart? No - copy requested  Pre-existing out of facility DNR order (yellow form or pink MOST form) -     Chief Complaint  Patient presents with  . Medical Management of Chronic Issues    HPI:  Pt is a 83 y.o. female seen today for medical management of chronic diseases. Pt is currently on Hospice Care. Pt has been stable this month. No falls. Weight has been stable. Diet is pureed. Incontinent of B&B. Please note, pt with severe dementia. Essentially non-verbal. Unable to get complete ROS. Per nursing, no c/o pain, no dyspnea. VSS. No other complaints.       Past Medical History:  Diagnosis Date  . Anxiety   . Bradycardia   . Dementia   . Dysrhythmia, cardiac   . Hypertension   . Insomnia   . Osteoporosis    No past surgical history on file.  Allergies  Allergen Reactions  . Aspirin   . Cefuroxime Axetil   . Darvon   . Penicillins   . Procaine Hcl   . Risedronate Sodium     Allergies as of 02/19/2017      Reactions   Aspirin    Cefuroxime Axetil    Darvon    Penicillins    Procaine Hcl    Risedronate Sodium       Medication List       Accurate as of 02/19/17 11:59 PM. Always use your most recent med list.          clonazePAM 0.5 MG tablet Commonly known as:  KLONOPIN Take 0.25 mg by mouth 2 (two) times daily as needed.   DOCUSATE SODIUM PO Take by mouth daily.   donepezil 10 MG tablet Commonly known as:  ARICEPT Take 1 tablet (10 mg total) by mouth daily.   eszopiclone 1 MG Tabs tablet Commonly known as:  LUNESTA Take 1 mg by mouth at bedtime as needed. Take immediately before bedtime   FLONASE 50 MCG/ACT nasal spray Generic drug:  fluticasone Place 2 sprays into the nose.   Iron 240 (27 Fe) MG Tabs Take by mouth daily.   multivitamin tablet Take 1 tablet by mouth daily.   psyllium 58.6 % powder Commonly known as:  METAMUCIL Take 1 packet by mouth. Mixed with 6-8 fluid ounces daily   sertraline 50 MG tablet Commonly known as:  ZOLOFT Take 1.5 tablets (75 mg total) by mouth daily.   traMADol 50 MG tablet Commonly known as:  ULTRAM Take 50 mg by mouth 2 (two) times daily as needed.   vitamin C 500 MG tablet Commonly known  as:  ASCORBIC ACID Take 500 mg by mouth daily.   Vitamin D 2000 units Caps Take by mouth daily.   vitamin E 400 UNIT capsule Take 400 Units by mouth daily.       Review of Systems  Unable to perform ROS: Dementia  Constitutional: Negative for activity change, appetite change, chills, diaphoresis, fatigue and fever.  HENT: Negative for congestion (clears with cough), sneezing, sore throat, trouble swallowing and voice change.   Eyes: Negative.   Respiratory: Negative for cough (slight) and shortness of breath.   Cardiovascular: Negative for chest pain.  Gastrointestinal: Negative.   Genitourinary: Negative.  Negative for difficulty urinating, dysuria, frequency and urgency.  Musculoskeletal: Negative.  Negative for back pain, gait problem and myalgias. Arthralgias: typical arthritis.  Skin: Negative for color change,  pallor, rash and wound.  Neurological: Negative for dizziness, tremors, syncope, speech difficulty, weakness, numbness and headaches.  Hematological: Negative.   Psychiatric/Behavioral: Negative for agitation and behavioral problems.  All other systems reviewed and are negative.    There is no immunization history on file for this patient. Pertinent  Health Maintenance Due  Topic Date Due  . DEXA SCAN  02/02/1992  . PNA vac Low Risk Adult (1 of 2 - PCV13) 02/02/1992  . INFLUENZA VACCINE  06/18/2017   No flowsheet data found. Functional Status Survey:    Vitals:   02/18/17 1200  BP: 128/68  Pulse: 64  Resp: 18  Temp: 97.5 F (36.4 C)  SpO2: 95%  Weight: 133 lb 3.2 oz (60.4 kg)   Body mass index is 23.6 kg/m. Physical Exam  Constitutional: She is oriented to person, place, and time. She appears well-developed and well-nourished. She appears listless. She is active and cooperative. She has a sickly appearance. She does not appear ill. No distress. Nasal cannula in place.  HENT:  Head: Normocephalic and atraumatic.  Mouth/Throat: Uvula is midline, oropharynx is clear and moist and mucous membranes are normal. Mucous membranes are not pale, not dry and not cyanotic.  Eyes: Conjunctivae, EOM and lids are normal. Pupils are equal, round, and reactive to light.  Neck: Trachea normal, normal range of motion and full passive range of motion without pain. Neck supple. No JVD present. No tracheal deviation, no edema and no erythema present. No thyromegaly present.  Cardiovascular: Normal rate, normal heart sounds, intact distal pulses and normal pulses.  An irregular rhythm present. Exam reveals no gallop, no distant heart sounds and no friction rub.   No murmur heard. Pulmonary/Chest: Effort normal. No accessory muscle usage. No respiratory distress. She has decreased breath sounds in the right upper field and the left upper field. She has no wheezes. She has rhonchi (slight- clears with  cough) in the right middle field, the right lower field, the left middle field and the left lower field. She has no rales. She exhibits no tenderness.  Abdominal: Soft. Normal appearance and bowel sounds are normal. She exhibits no distension and no ascites. There is no tenderness.  Musculoskeletal: Normal range of motion. She exhibits no edema or tenderness.  Expected osteoarthritis, stiffness  Neurological: She is oriented to person, place, and time. She has normal strength. She appears listless.  Skin: Skin is warm, dry and intact. No rash noted. She is not diaphoretic. No cyanosis or erythema. No pallor. Nails show no clubbing.  Psychiatric: She has a normal mood and affect. Her speech is normal and behavior is normal. Judgment and thought content normal. Cognition and memory are normal.  Nursing note and vitals reviewed.   Labs reviewed:  Recent Labs  09/17/16 1600 09/24/16 1545  NA 139 139  K 3.8 4.1  CL 103 105  CO2 29 26  GLUCOSE 125* 90  BUN 32* 33*  CREATININE 1.14* 0.82  CALCIUM 9.0 9.1    Recent Labs  09/17/16 1600 09/24/16 1545  AST 18 17  ALT 14 14  ALKPHOS 66 69  BILITOT <0.1* 0.4  PROT 6.7 6.7  ALBUMIN 4.0 3.9    Recent Labs  09/17/16 1600 09/24/16 1545  WBC 5.5 6.2  NEUTROABS 3.2 3.4  HGB 14.7 14.9  HCT 44.7 43.6  MCV 90.4 87.8  PLT 143* 151   Lab Results  Component Value Date   TSH 0.751 12/07/2011   Lab Results  Component Value Date   HGBA1C 6.0 12/09/2011   No results found for: CHOL, HDL, LDLCALC, LDLDIRECT, TRIG, CHOLHDL  Significant Diagnostic Results in last 30 days:  No results found.  Assessment/Plan 1. Late onset Alzheimer's disease without behavioral disturbance  Stable  Continue Hospice care  Continue Morphine 20 mg/mL 0.25-0.5 mL po/sl Q 1 hour prn pain, dyspnea, restlessness  2. Mood disorder (HCC)  stable  3. Bradycardia  Stable   4. Major depressive disorder with single episode, remission status  unspecified  Stable  Continue Sertraline 75 mg po Q Day  5. Anxiety disorder, unspecified type  Stable  Lorazepam 0.5 mg po Q4 hours prn  6. Essential (primary) hypertension  Stable  7. Insomnia, unspecified type  Stable  Continue trazodone 50 mg- 1/2 tablet po Q HS  8. Age-related osteoporosis without current pathological fracture  Stable  Continue Cholecalciferol 1,000 units po daily  9. Allergic rhinitis, unspecified chronicity, unspecified seasonality, unspecified trigger  Stable  Continue Fluticasone 50 mcg- 2 sprays each nostril at bedtime  10. Poor Nutrition  Stable  Continue Ensure 1 can once daily  Continue Vitamin C 500 mg po Q Day  Continue Ferate 240 mg po Q day  Continue Cerovite MVI 1 tablet once daily   Family/ staff Communication:   Total Time:  Documentation:  Face to Face:  Family/Phone:   Labs/tests ordered:    Medication list reviewed and assessed for continued appropriateness. Monthly medication orders reviewed and signed.  Brynda Rim, NP-C [facility] [phone] [address], [place] [address] Cell Phone (Mon-Fri 8am-5pm):  [phone] On Call:  [phone] & follow prompts after 5pm & weekends Office Phone:  [phone] Office Fax:  [phone]

## 2017-03-18 ENCOUNTER — Encounter: Admission: RE | Admit: 2017-03-18 | Payer: Medicare Other | Source: Ambulatory Visit | Admitting: Internal Medicine

## 2017-03-18 DEATH — deceased
